# Patient Record
Sex: Male | Born: 1980 | Race: Black or African American | Hispanic: No | Marital: Single | Smoking: Never smoker
Health system: Southern US, Community
[De-identification: ages and names within clinical notes are randomized; demographics above are authoritative.]

## PROBLEM LIST (undated history)

## (undated) DIAGNOSIS — F4310 Post-traumatic stress disorder, unspecified: Secondary | ICD-10-CM

## (undated) DIAGNOSIS — F410 Panic disorder [episodic paroxysmal anxiety] without agoraphobia: Secondary | ICD-10-CM

## (undated) DIAGNOSIS — R55 Syncope and collapse: Secondary | ICD-10-CM

---

## 2009-09-25 ENCOUNTER — Emergency Department (HOSPITAL_COMMUNITY): Admission: EM | Admit: 2009-09-25 | Discharge: 2009-09-25 | Payer: Self-pay | Admitting: Emergency Medicine

## 2013-06-27 ENCOUNTER — Emergency Department (HOSPITAL_COMMUNITY)
Admission: EM | Admit: 2013-06-27 | Discharge: 2013-06-27 | Disposition: A | Discharge status: Home / Self Care | Payer: Non-veteran care | Attending: Emergency Medicine | Admitting: Emergency Medicine

## 2013-06-27 ENCOUNTER — Encounter (HOSPITAL_COMMUNITY): Payer: Self-pay | Admitting: Emergency Medicine

## 2013-06-27 DIAGNOSIS — Z79899 Other long term (current) drug therapy: Secondary | ICD-10-CM | POA: Insufficient documentation

## 2013-06-27 DIAGNOSIS — X131XXA Other contact with steam and other hot vapors, initial encounter: Secondary | ICD-10-CM

## 2013-06-27 DIAGNOSIS — Y929 Unspecified place or not applicable: Secondary | ICD-10-CM | POA: Insufficient documentation

## 2013-06-27 DIAGNOSIS — Z880 Allergy status to penicillin: Secondary | ICD-10-CM | POA: Insufficient documentation

## 2013-06-27 DIAGNOSIS — X12XXXA Contact with other hot fluids, initial encounter: Secondary | ICD-10-CM | POA: Insufficient documentation

## 2013-06-27 DIAGNOSIS — Y939 Activity, unspecified: Secondary | ICD-10-CM | POA: Insufficient documentation

## 2013-06-27 DIAGNOSIS — T25229A Burn of second degree of unspecified foot, initial encounter: Secondary | ICD-10-CM | POA: Insufficient documentation

## 2013-06-27 DIAGNOSIS — T25221A Burn of second degree of right foot, initial encounter: Secondary | ICD-10-CM

## 2013-06-27 MED ORDER — SILVER SULFADIAZINE 1 % EX CREA: 1.0000 "application " | TOPICAL_CREAM | Freq: Every day | CUTANEOUS | Status: DC

## 2013-06-27 NOTE — ED Provider Notes (Signed)
Medical screening examination/treatment/procedure(s) were performed by non-physician practitioner and as supervising physician I was immediately available for consultation/collaboration.  EKG Interpretation   None         Dagmar HaitWilliam Manjinder Breau, MD 06/27/13 2326

## 2013-06-27 NOTE — ED Notes (Signed)
Has burn to top of right foot, reports spilling hot water on his foot two days ago.

## 2013-06-27 NOTE — Discharge Instructions (Signed)
1. Medications: silvadene, usual home medications 2. Treatment: rest, drink plenty of fluids, keep wound clean with warm soap and water, keep bandages dry 3. Follow Up: Please followup with your primary doctor for discussion of your diagnoses and further evaluation after today's visit; if you do not have a primary care doctor use the resource guide provided to find one; Return to the ED for increasing redness, fevers or intractable vomiting.   Burn Care Your skin is a natural barrier to infection. It is the largest organ of your body. Burns damage this natural protection. To help prevent infection, it is very important to follow your caregiver's instructions in the care of your burn. Burns are classified as:  First degree. There is only redness of the skin (erythema). No scarring is expected.  Second degree. There is blistering of the skin. Scarring may occur with deeper burns.  Third degree. All layers of the skin are injured, and scarring is expected. HOME CARE INSTRUCTIONS   Wash your hands well before changing your bandage.  Change your bandage as often as directed by your caregiver.  Remove the old bandage. If the bandage sticks, you may soak it off with cool, clean water.  Cleanse the burn thoroughly but gently with mild soap and water.  Pat the area dry with a clean, dry cloth.  Apply a thin layer of antibacterial cream to the burn.  Apply a clean bandage as instructed by your caregiver.  Keep the bandage as clean and dry as possible.  Elevate the affected area for the first 24 hours, then as instructed by your caregiver.  Only take over-the-counter or prescription medicines for pain, discomfort, or fever as directed by your caregiver. SEEK IMMEDIATE MEDICAL CARE IF:   You develop excessive pain.  You develop redness, tenderness, swelling, or red streaks near the burn.  The burned area develops yellowish-white fluid (pus) or a bad smell.  You have a fever. MAKE SURE  YOU:   Understand these instructions.  Will watch your condition.  Will get help right away if you are not doing well or get worse. Document Released: 05/30/2005 Document Revised: 08/22/2011 Document Reviewed: 10/20/2010 [facility] Patient Information 2014 [place], [place].

## 2013-06-27 NOTE — ED Provider Notes (Signed)
CSN: [id number]     Arrival date & time 06/27/13  1653 History  This chart was scribed for non-physician practitioner Dierdre Forth, PA, working with Dagmar Hait, MD by Ronal Fear, ED scribe. This patient was seen in room TR05C/TR05C and the patient's care was started at 6:16 PM.     Chief Complaint  Patient presents with  . Burn   (Consider location/radiation/quality/duration/timing/severity/associated sxs/prior Treatment) The history is provided by the patient and medical records. No language interpreter was used.   HPI Comments: Hunter Riddle is a 33 y.o. male who presents to the Emergency Department complaining of a right side foot burn with associated throbbing, constant pain onset 2x days ago after spilling boiling water on his foot. Pt states that standing on it and movement makes the pain worse. Pt has washed the wound with warm soap and water and wrapped the foot. He has taken ibuprofen and elevated the foot which he states has helped with the pain. Pt denies fever, chills, N/V. 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  No primary provider on file.   History reviewed. No pertinent past medical history. History reviewed. No pertinent past surgical history. History reviewed. No pertinent family history. History  Substance Use Topics  . Smoking status: Never Smoker   . Smokeless tobacco: Not on file  . Alcohol Use: No    Review of Systems  Constitutional: Negative for fever and chills.  Gastrointestinal: Negative for nausea and vomiting.  Skin: Positive for wound. Negative for rash.  Allergic/Immunologic: Negative for immunocompromised state.  Hematological: Does not bruise/bleed easily.  Psychiatric/Behavioral: The patient is not nervous/anxious.     Allergies  Penicillins  Home Medications   Current Outpatient Rx  Name  Route  Sig  Dispense  Refill  . ibuprofen (ADVIL,MOTRIN) 200 MG tablet   Oral   Take 400 mg by mouth every 6 (six) hours as  needed for moderate pain.         . silver sulfADIAZINE (SILVADENE) 1 % cream   Topical   Apply 1 application topically daily.   50 g   2    BP 117/66  Pulse 63  Temp(Src) 98.7 F (37.1 C) (Oral)  Resp 18  SpO2 100% Physical Exam  Nursing note and vitals reviewed. Constitutional: He is oriented to person, place, and time. He appears well-developed and well-nourished. No distress.  HENT:  Head: Normocephalic and atraumatic.  Eyes: Conjunctivae are normal. No scleral icterus.  Neck: Normal range of motion.  Cardiovascular: Normal rate, regular rhythm, normal heart sounds and intact distal pulses.   No murmur heard. No tachycardia Capillary refill less than 3 seconds  Pulmonary/Chest: Effort normal and breath sounds normal.  Abdominal: Soft. He exhibits no distension. There is no tenderness.  Musculoskeletal:  Full range of motion of the ankle and all toes  Lymphadenopathy:    He has no cervical adenopathy.  Neurological: He is alert and oriented to person, place, and time.  Sensation intact to dull and sharp throughout Strength 5 out of 5 including dorsiflexion and plantar flexion  Skin: Skin is warm and dry. He is not diaphoretic. There is erythema.  12cm x 6 cm area of partial thickness burn with discrete borders to the dorsum of the right foot No erythema, induration, fluctuance or drainage from the site  Psychiatric: He has a normal mood and affect.    ED Course  Procedures (including critical care time)  DIAGNOSTIC STUDIES: Oxygen Saturation is  100% on RA, normal by my interpretation.    COORDINATION OF CARE: 6:24 PM- Pt advised of plan for treatment including silvadene cream for the burn and pt agrees.    Labs Review Labs Reviewed - No data to display Imaging Review No results found.  EKG Interpretation   None       MDM   1. Burn of foot, right, second degree     Lovenia ShuckBrian Ralphs presents with partial thickness burn to the dorsum of the right  foot. No erythema or induration, no drainage. No signs of infection or cellulitis. No fluctuance or evidence of abscess.  Patient is non-tachycardic, afebrile without complaints of nausea or vomiting.  Will prescribe Silvadene. Wound care instructions given. Close followup recommended.  It has been determined that no acute conditions requiring further emergency intervention are present at this time. The patient/guardian have been advised of the diagnosis and plan. We have discussed signs and symptoms that warrant return to the ED, such as changes or worsening in symptoms.   Vital signs are stable at discharge.   BP 117/66  Pulse 63  Temp(Src) 98.7 F (37.1 C) (Oral)  Resp 18  SpO2 100%  Patient/guardian has voiced understanding and agreed to follow-up with the PCP or specialist.    I personally performed the services described in this documentation, which was scribed in my presence. The recorded information has been reviewed and is accurate.   Dierdre ForthHannah Fareedah Mahler, PA-C 06/27/13 1843

## 2013-07-03 ENCOUNTER — Encounter (HOSPITAL_COMMUNITY): Payer: Self-pay | Admitting: Emergency Medicine

## 2013-07-03 ENCOUNTER — Emergency Department (INDEPENDENT_AMBULATORY_CARE_PROVIDER_SITE_OTHER)
Admission: EM | Admit: 2013-07-03 | Discharge: 2013-07-03 | Disposition: A | Discharge status: Home / Self Care | Payer: Non-veteran care | Source: Home / Self Care | Attending: Emergency Medicine | Admitting: Emergency Medicine

## 2013-07-03 DIAGNOSIS — T25021A Burn of unspecified degree of right foot, initial encounter: Secondary | ICD-10-CM

## 2013-07-03 DIAGNOSIS — T25029A Burn of unspecified degree of unspecified foot, initial encounter: Secondary | ICD-10-CM

## 2013-07-03 DIAGNOSIS — Z23 Encounter for immunization: Secondary | ICD-10-CM

## 2013-07-03 MED ORDER — TETANUS-DIPHTH-ACELL PERTUSSIS 5-2.5-18.5 LF-MCG/0.5 IM SUSP
INTRAMUSCULAR | Status: AC
  Filled 2013-07-03: qty 0.5

## 2013-07-03 MED ORDER — TETANUS-DIPHTH-ACELL PERTUSSIS 5-2.5-18.5 LF-MCG/0.5 IM SUSP
0.5000 mL | Freq: Once | INTRAMUSCULAR | Status: AC
  Administered 2013-07-03: 0.5 mL via INTRAMUSCULAR

## 2013-07-03 NOTE — ED Notes (Signed)
Pt  Sustained  A  Thermal  Burn  To the  Top  Of his  r  Foot  10  Days  Ago        He was  Seen  Er here  Today  For  A  Recheck of the  Burn   The burn appears to  Be  Healing       He  Does report  Pain  When  Pressure  Is  Put  On the  Foot

## 2013-07-03 NOTE — ED Provider Notes (Signed)
CSN: [id number]     Arrival date & time 07/03/13  0818 History   First MD Initiated Contact with Patient 07/03/13 0831     Chief Complaint  Patient presents with  . Wound Check   (Consider location/radiation/quality/duration/timing/severity/associated sxs/prior Treatment) HPI Comments: Patient suffered a partial thickness burn from hot water spill to his right foot on 06/23/2013. [facility] with silvadene dressing and advised to come for wound re-check. Denies any difficulties with wound since initial treatment. Last tetanus booster unknown.   Patient is a 33 y.o. male presenting with wound check. The history is provided by the patient.  Wound Check    History reviewed. No pertinent past medical history. History reviewed. No pertinent past surgical history. History reviewed. No pertinent family history. History  Substance Use Topics  . Smoking status: Never Smoker   . Smokeless tobacco: Not on file  . Alcohol Use: No    Review of Systems  All other systems reviewed and are negative.    Allergies  Penicillins  Home Medications   Current Outpatient Rx  Name  Route  Sig  Dispense  Refill  . ibuprofen (ADVIL,MOTRIN) 200 MG tablet   Oral   Take 400 mg by mouth every 6 (six) hours as needed for moderate pain.         . silver sulfADIAZINE (SILVADENE) 1 % cream   Topical   Apply 1 application topically daily.   50 g   2    BP 113/74  Pulse 65  Temp(Src) 97.8 F (36.6 C) (Oral)  Resp 18  SpO2 100% Physical Exam  Nursing note and vitals reviewed. Constitutional: He is oriented to person, place, and time. He appears well-developed and well-nourished. No distress.  HENT:  Head: Normocephalic and atraumatic.  Cardiovascular: Normal rate.   Pulmonary/Chest: Effort normal.  Neurological: He is alert and oriented to person, place, and time.  Skin: Skin is warm and dry.  3 x 5 cm healing  partial thickness burn to dorsum of right  foot. No indications of infection. Wound is minimally tender without drainage.   Psychiatric: He has a normal mood and affect. His behavior is normal.    ED Course  Procedures (including critical care time) Labs Review Labs Reviewed - No data to display Imaging Review No results found.  EKG Interpretation    Date/Time:    Ventricular Rate:    PR Interval:    QRS Duration:   QT Interval:    QTC Calculation:   R Axis:     Text Interpretation:              MDM      Ardis RowanJennifer Lee Merlen Gurry, PA 07/03/13 [phone]

## 2013-07-03 NOTE — ED Provider Notes (Signed)
Medical screening examination/treatment/procedure(s) were performed by non-physician practitioner and as supervising physician I was immediately available for consultation/collaboration.  Devontre Siedschlag, M.D.   [name] C [name], MD 07/03/13 1401 

## 2013-07-03 NOTE — Discharge Instructions (Signed)

## 2013-11-09 ENCOUNTER — Emergency Department (HOSPITAL_COMMUNITY): Payer: Non-veteran care

## 2013-11-09 ENCOUNTER — Encounter (HOSPITAL_COMMUNITY): Payer: Self-pay | Admitting: Emergency Medicine

## 2013-11-09 ENCOUNTER — Observation Stay (HOSPITAL_COMMUNITY)
Admission: EM | Admit: 2013-11-09 | Discharge: 2013-11-12 | Disposition: A | Discharge status: Home / Self Care | Payer: Non-veteran care | Attending: Internal Medicine | Admitting: Internal Medicine

## 2013-11-09 DIAGNOSIS — Z8659 Personal history of other mental and behavioral disorders: Secondary | ICD-10-CM | POA: Diagnosis not present

## 2013-11-09 DIAGNOSIS — R0789 Other chest pain: Secondary | ICD-10-CM | POA: Diagnosis not present

## 2013-11-09 DIAGNOSIS — D509 Iron deficiency anemia, unspecified: Secondary | ICD-10-CM

## 2013-11-09 DIAGNOSIS — S199XXA Unspecified injury of neck, initial encounter: Secondary | ICD-10-CM

## 2013-11-09 DIAGNOSIS — S0993XA Unspecified injury of face, initial encounter: Secondary | ICD-10-CM | POA: Insufficient documentation

## 2013-11-09 DIAGNOSIS — Z79899 Other long term (current) drug therapy: Secondary | ICD-10-CM | POA: Insufficient documentation

## 2013-11-09 DIAGNOSIS — R296 Repeated falls: Secondary | ICD-10-CM | POA: Diagnosis not present

## 2013-11-09 DIAGNOSIS — Y9389 Activity, other specified: Secondary | ICD-10-CM | POA: Diagnosis not present

## 2013-11-09 DIAGNOSIS — Y9229 Other specified public building as the place of occurrence of the external cause: Secondary | ICD-10-CM | POA: Diagnosis not present

## 2013-11-09 DIAGNOSIS — R209 Unspecified disturbances of skin sensation: Secondary | ICD-10-CM | POA: Diagnosis not present

## 2013-11-09 DIAGNOSIS — R079 Chest pain, unspecified: Secondary | ICD-10-CM

## 2013-11-09 DIAGNOSIS — S3981XA Other specified injuries of abdomen, initial encounter: Secondary | ICD-10-CM | POA: Insufficient documentation

## 2013-11-09 DIAGNOSIS — S060X9A Concussion with loss of consciousness of unspecified duration, initial encounter: Secondary | ICD-10-CM | POA: Diagnosis not present

## 2013-11-09 DIAGNOSIS — R4182 Altered mental status, unspecified: Secondary | ICD-10-CM | POA: Diagnosis not present

## 2013-11-09 DIAGNOSIS — Z880 Allergy status to penicillin: Secondary | ICD-10-CM | POA: Diagnosis not present

## 2013-11-09 DIAGNOSIS — F1210 Cannabis abuse, uncomplicated: Secondary | ICD-10-CM

## 2013-11-09 DIAGNOSIS — R55 Syncope and collapse: Principal | ICD-10-CM | POA: Insufficient documentation

## 2013-11-09 DIAGNOSIS — F1290 Cannabis use, unspecified, uncomplicated: Secondary | ICD-10-CM

## 2013-11-09 DIAGNOSIS — S060XAA Concussion with loss of consciousness status unknown, initial encounter: Secondary | ICD-10-CM | POA: Diagnosis not present

## 2013-11-09 DIAGNOSIS — R404 Transient alteration of awareness: Secondary | ICD-10-CM | POA: Diagnosis present

## 2013-11-09 HISTORY — DX: Syncope and collapse: R55

## 2013-11-09 HISTORY — DX: Post-traumatic stress disorder, unspecified: F43.10

## 2013-11-09 HISTORY — DX: Panic disorder (episodic paroxysmal anxiety): F41.0

## 2013-11-09 LAB — BASIC METABOLIC PANEL
BUN: 5 mg/dL — ABNORMAL LOW (ref 6–23)
CHLORIDE: 105 meq/L (ref 96–112)
CO2: 27 meq/L (ref 19–32)
CREATININE: 0.81 mg/dL (ref 0.50–1.35)
Calcium: 8.3 mg/dL — ABNORMAL LOW (ref 8.4–10.5)
GFR calc Af Amer: >90 mL/min (ref >90)
GFR calc non Af Amer: >90 mL/min (ref >90)
GLUCOSE: 98 mg/dL (ref 70–99)
Potassium: 3.7 mEq/L (ref 3.7–5.3)
Sodium: 139 mEq/L (ref 137–147)

## 2013-11-09 LAB — CBC WITH DIFFERENTIAL/PLATELET
BASOS ABS: 0 10*3/uL (ref 0.0–0.1)
Basophils Relative: 1 % (ref 0–1)
Eosinophils Absolute: 0 10*3/uL (ref 0.0–0.7)
Eosinophils Relative: 1 % (ref 0–5)
HEMATOCRIT: 37.9 % — AB (ref 39.0–52.0)
Hemoglobin: 12.3 g/dL — ABNORMAL LOW (ref 13.0–17.0)
LYMPHS ABS: 1.9 10*3/uL (ref 0.7–4.0)
LYMPHS PCT: 33 % (ref 12–46)
MCH: 23.9 pg — ABNORMAL LOW (ref 26.0–34.0)
MCHC: 32.5 g/dL (ref 30.0–36.0)
MCV: 73.6 fL — AB (ref 78.0–100.0)
MONO ABS: 0.5 10*3/uL (ref 0.1–1.0)
Monocytes Relative: 9 % (ref 3–12)
Neutro Abs: 3.3 10*3/uL (ref 1.7–7.7)
Neutrophils Relative %: 57 % (ref 43–77)
Platelets: 246 10*3/uL (ref 150–400)
RBC: 5.15 MIL/uL (ref 4.22–5.81)
RDW: 14.6 % (ref 11.5–15.5)
WBC: 5.9 10*3/uL (ref 4.0–10.5)

## 2013-11-09 LAB — RAPID URINE DRUG SCREEN, HOSP PERFORMED
AMPHETAMINES: NOT DETECTED
BENZODIAZEPINES: NOT DETECTED
Barbiturates: NOT DETECTED
Cocaine: NOT DETECTED
Opiates: NOT DETECTED
TETRAHYDROCANNABINOL: POSITIVE — AB

## 2013-11-09 LAB — TROPONIN I: Troponin I: <0.3 ng/mL (ref <0.30)

## 2013-11-09 LAB — D-DIMER, QUANTITATIVE: D-Dimer, Quant: 0.28 ug/mL-FEU (ref 0.00–0.48)

## 2013-11-09 LAB — RETICULOCYTES
RBC.: 4.72 MIL/uL (ref 4.22–5.81)
RETIC CT PCT: 1.6 % (ref 0.4–3.1)
Retic Count, Absolute: 75.5 10*3/uL (ref 19.0–186.0)

## 2013-11-09 LAB — ETHANOL

## 2013-11-09 LAB — GLUCOSE, RANDOM: Glucose, Bld: 101 mg/dL — ABNORMAL HIGH (ref 70–99)

## 2013-11-09 LAB — I-STAT TROPONIN, ED: TROPONIN I, POC: 0.01 ng/mL (ref 0.00–0.08)

## 2013-11-09 MED ORDER — SODIUM CHLORIDE 0.9 % IJ SOLN
3.0000 mL | Freq: Two times a day (BID) | INTRAMUSCULAR | Status: DC
  Administered 2013-11-09: 3 mL via INTRAVENOUS

## 2013-11-09 MED ORDER — ONDANSETRON HCL 4 MG PO TABS: 4.0000 mg | ORAL_TABLET | Freq: Four times a day (QID) | ORAL | Status: DC | PRN

## 2013-11-09 MED ORDER — ONDANSETRON HCL 4 MG/2ML IJ SOLN: 4.0000 mg | Freq: Four times a day (QID) | INTRAMUSCULAR | Status: DC | PRN

## 2013-11-09 MED ORDER — ACETAMINOPHEN 325 MG PO TABS: 650.0000 mg | ORAL_TABLET | Freq: Four times a day (QID) | ORAL | Status: DC | PRN

## 2013-11-09 MED ORDER — HYDROMORPHONE HCL PF 1 MG/ML IJ SOLN: 0.5000 mg | INTRAMUSCULAR | Status: DC | PRN

## 2013-11-09 MED ORDER — ALUM & MAG HYDROXIDE-SIMETH 200-200-20 MG/5ML PO SUSP: 30.0000 mL | Freq: Four times a day (QID) | ORAL | Status: DC | PRN

## 2013-11-09 MED ORDER — ONDANSETRON HCL 4 MG/2ML IJ SOLN
4.0000 mg | Freq: Once | INTRAMUSCULAR | Status: AC
  Administered 2013-11-09: 4 mg via INTRAVENOUS
  Filled 2013-11-09: qty 2

## 2013-11-09 MED ORDER — OXYCODONE HCL 5 MG PO TABS: 5.0000 mg | ORAL_TABLET | ORAL | Status: DC | PRN

## 2013-11-09 MED ORDER — ACETAMINOPHEN 650 MG RE SUPP: 650.0000 mg | Freq: Four times a day (QID) | RECTAL | Status: DC | PRN

## 2013-11-09 MED ORDER — SODIUM CHLORIDE 0.9 % IV BOLUS (SEPSIS)
1000.0000 mL | Freq: Once | INTRAVENOUS | Status: AC
  Administered 2013-11-09: 1000 mL via INTRAVENOUS

## 2013-11-09 MED ORDER — SODIUM CHLORIDE 0.9 % IV SOLN
INTRAVENOUS | Status: DC
  Administered 2013-11-09 – 2013-11-11 (×4): via INTRAVENOUS

## 2013-11-09 NOTE — ED Notes (Signed)
Pt to ED after reported having a syncopal episode while standing in line at [employer].  St's he hit back of his head when he fell and c/o headache.  Pt refused EMS transport and then drove to work.  Pt was brought to ED by friend.

## 2013-11-09 NOTE — ED Provider Notes (Signed)
CSN: [id number]     Arrival date & time 11/09/13  1615 History   First MD Initiated Contact with Patient 11/09/13 1619     Chief Complaint  Patient presents with  . Loss of Consciousness     (Consider location/radiation/quality/duration/timing/severity/associated sxs/prior Treatment) HPI  Patient reports he was standing in line at a store, started feeling "woozy," tried to lean against a counter top, then remembers waking up with people standing over him.  Reports he has pain in his head and neck, tingling in his bilateral fingers, tightness in his toes, pressure in his chest.   After event, pt refused EMS transport, drove himself to work and then was brought to ED by work Animatorcolleague.    5:40 PM Significant other now bedside. Notes pt has hx syncopal episodes with unknown cause.  Has never seen a doctor for this.   6:26 PM Pt now states he has chest heaviness and SOB prior to all of his syncopal episodes.    Level V caveat for uncooperativeness vs sleepiness vs head injury.   Past Medical History  Diagnosis Date  . Panic attack   . PTSD (post-traumatic stress disorder)   . Syncopal episodes    History reviewed. No pertinent past surgical history. No family history on file. History  Substance Use Topics  . Smoking status: Never Smoker   . Smokeless tobacco: Not on file  . Alcohol Use: No    Review of Systems  Unable to perform ROS: Mental status change      Allergies  Penicillins  Home Medications   Prior to Admission medications   Medication Sig Start Date End Date Taking? Authorizing Provider  ibuprofen (ADVIL,MOTRIN) 200 MG tablet Take 400 mg by mouth every 6 (six) hours as needed for moderate pain.    Historical Provider, MD  silver sulfADIAZINE (SILVADENE) 1 % cream Apply 1 application topically daily. 06/27/13   Hannah Muthersbaugh, PA-C   BP 112/59  Pulse 63  Temp(Src) 98 F (36.7 C) (Oral)  Resp 19  Ht 5\' 11"  (1.803 m)  Wt 182 lb (82.555 kg)  BMI 25.40  kg/m2  SpO2 100% Physical Exam  Nursing note and vitals reviewed. Constitutional: He appears well-developed and well-nourished. No distress.  HENT:  Head: Normocephalic.  Neck: Neck supple.  Cardiovascular: Normal rate and regular rhythm.   Pulmonary/Chest: Effort normal and breath sounds normal. No respiratory distress. He has no wheezes. He has no rales.  Abdominal: Soft. He exhibits no distension and no mass. There is generalized tenderness. There is no rebound and no guarding.  Neurological: He is alert. He has normal strength. No sensory deficit. He exhibits normal muscle tone. GCS eye subscore is 4. GCS verbal subscore is 5. GCS motor subscore is 6.  Pt sleepy vs uncooperative.  He responds to commands with some prompting   Skin: He is not diaphoretic.    ED Course  Procedures (including critical care time) Labs Review Labs Reviewed  CBC WITH DIFFERENTIAL - Abnormal; Notable for the following:    Hemoglobin 12.3 (*)    HCT 37.9 (*)    MCV 73.6 (*)    MCH 23.9 (*)    All other components within normal limits  BASIC METABOLIC PANEL - Abnormal; Notable for the following:    BUN 5 (*)    Calcium 8.3 (*)    All other components within normal limits  URINE RAPID DRUG SCREEN (HOSP PERFORMED) - Abnormal; Notable for the following:    Tetrahydrocannabinol POSITIVE (*)  All other components within normal limits  ETHANOL  D-DIMER, QUANTITATIVE  I-STAT TROPOININ, ED    Imaging Review Dg Chest 2 View  11/09/2013   CLINICAL DATA:  Syncope  EXAM: CHEST  2 VIEW  COMPARISON:  None.  FINDINGS: Lungs are clear.  No pleural effusion or pneumothorax.  The heart is normal in size.  Visualized osseous structures are within normal limits.  IMPRESSION: Normal chest radiographs.   Electronically Signed   By: Charline Bills M.D.   On: 11/09/2013 17:27   Ct Head Wo Contrast  11/09/2013   CLINICAL DATA:  LOSS OF CONSCIOUSNESS  EXAM: CT HEAD WITHOUT CONTRAST  CT CERVICAL SPINE WITHOUT  CONTRAST  TECHNIQUE: Multidetector CT imaging of the head and cervical spine was performed following the standard protocol without intravenous contrast. Multiplanar CT image reconstructions of the cervical spine were also generated.  COMPARISON:  None.  FINDINGS: CT HEAD FINDINGS  There is no evidence of mass effect, midline shift or extra-axial fluid collections. There is no evidence of a space-occupying lesion or intracranial hemorrhage. There is no evidence of a cortical-based area of acute infarction.  The ventricles and sulci are appropriate for the patient's age. The basal cisterns are patent.  Visualized portions of the orbits are unremarkable. The visualized portions of the paranasal sinuses and mastoid air cells are unremarkable.  The osseous structures are unremarkable.  CT CERVICAL SPINE FINDINGS  The alignment is anatomic. The vertebral body heights are maintained. There is no acute fracture. There is no static listhesis. The prevertebral soft tissues are normal. The intraspinal soft tissues are not fully imaged on this examination due to poor soft tissue contrast, but there is no gross soft tissue abnormality.  The disc spaces are maintained.  The visualized portions of the lung apices demonstrate no focal abnormality.  IMPRESSION: 1. No acute intracranial pathology. 2. No acute osseous injury of the cervical spine.   Electronically Signed   By: Elige Ko   On: 11/09/2013 17:37   Ct Cervical Spine Wo Contrast  11/09/2013   CLINICAL DATA:  LOSS OF CONSCIOUSNESS  EXAM: CT HEAD WITHOUT CONTRAST  CT CERVICAL SPINE WITHOUT CONTRAST  TECHNIQUE: Multidetector CT imaging of the head and cervical spine was performed following the standard protocol without intravenous contrast. Multiplanar CT image reconstructions of the cervical spine were also generated.  COMPARISON:  None.  FINDINGS: CT HEAD FINDINGS  There is no evidence of mass effect, midline shift or extra-axial fluid collections. There is no  evidence of a space-occupying lesion or intracranial hemorrhage. There is no evidence of a cortical-based area of acute infarction.  The ventricles and sulci are appropriate for the patient's age. The basal cisterns are patent.  Visualized portions of the orbits are unremarkable. The visualized portions of the paranasal sinuses and mastoid air cells are unremarkable.  The osseous structures are unremarkable.  CT CERVICAL SPINE FINDINGS  The alignment is anatomic. The vertebral body heights are maintained. There is no acute fracture. There is no static listhesis. The prevertebral soft tissues are normal. The intraspinal soft tissues are not fully imaged on this examination due to poor soft tissue contrast, but there is no gross soft tissue abnormality.  The disc spaces are maintained.  The visualized portions of the lung apices demonstrate no focal abnormality.  IMPRESSION: 1. No acute intracranial pathology. 2. No acute osseous injury of the cervical spine.   Electronically Signed   By: Elige Ko   On: 11/09/2013 17:37  EKG Interpretation   Date/Time:  Saturday Nov 09 2013 16:23:24 EDT Ventricular Rate:  62 PR Interval:  167 QRS Duration: 84 QT Interval:  408 QTC Calculation: 414 R Axis:   83 Text Interpretation:  Sinus rhythm ST elev, probable normal early repol  pattern No previous ECGs available Confirmed by ZACKOWSKI  MD, SCOTT  ([id number]) on 11/09/2013 5:09:09 PM      5:39 PM Pt still sleepy.  Significant other now bedside, states he has syncopal episodes at least once a year, has never followed up with a doctor about it.  Usually states he doesn't feel well prior to passing out.  Usually comes around quickly, never has any shaking.    6:31 PM Discussed pt with Dr Deretha Emory.  Have added POC troponin and d-dimer.    7:55 PM Discussed with Dr Lovell Sheehan.  Admit to obs tele Team 10.    MDM   Final diagnoses:  Syncope  Concussion    Pt with episode of syncope.  Per patient and  significant other, pt has had syncopal episodes previously, always preceded by chest pain and difficulty breathing for a few seconds.  This time is different in that he has been sleepy and slower to come back to baseline (still sleepy at time of admission).  I suspect he has sustained a concussion from falling and hitting his head.  His workup is unremarkable with exception of anemia, which is atypical for a male his age - he denies any abnormal bleeding including epistaxis, hemoptysis, hematemesis, hematuria, hematochezia, melena.  Pt admitted to [facility] for further evaluation and observation.   He has no PCP.      Trixie Dredge, PA-C 11/09/13 2009

## 2013-11-09 NOTE — ED Notes (Signed)
Dr. Lovell Sheehan in to assess pt for admission at this time.

## 2013-11-09 NOTE — ED Notes (Signed)
Attempted report 

## 2013-11-09 NOTE — H&P (Signed)
[facility] History and Physical  Hunter Riddle UJW:[id number] DOB: [birth year] DOA: 11/09/2013  Referring physician: EDP PCP: No PCP Per Patient  Specialists:   Chief Complaint: Passed Out  HPI: Hunter Riddle is a 33 y.o. male who was brought to the ED after he suffered a syncopal episode in [employer] while he was standing in line.    He reports that he felt chest pain and dizziness and then blacked out and fell back and hit his head.   He denied EMS transport to the ED and instead was brought to the ED by his friend.    He reports having syncope with the same  symptoms about once a year for many years and he has not had a hospital evaluation for the episodes.   He was evaluated in the ED and had a CT scan of the Head and Neck which was negative for acute findings and had labs performed which revealed a Microcytic Anemia, and he denies having any hematemesis, hematochezia, or melena passage.    He was referred for medical admission.     Review of Systems:  Constitutional: No Weight Loss, No Weight Gain, Night Sweats, Fevers, Chills, Fatigue, or Generalized Weakness HEENT: Headaches, Difficulty Swallowing,Tooth/Dental Problems,Sore Throat,  No Sneezing, Rhinitis, Ear Ache, Nasal Congestion, or Post Nasal Drip,  Cardio-vascular:  +Chest pain, Orthopnea, PND, Edema in lower extremities, Anasarca, Dizziness, Palpitations  Resp: No Dyspnea, No DOE, No Cough, No Hemoptysis,  No Wheezing.    GI: No Heartburn, Indigestion, Abdominal Pain, Nausea, Vomiting, Diarrhea, Change in Bowel Habits,  Loss of Appetite  GU: No Dysuria, Change in Color of Urine, No Urgency or Frequency.  No flank pain.  Musculoskeletal: No Joint Pain or Swelling.  No Decreased Range of Motion. No Back Pain.  Neurologic: +Syncope, No Seizures, Muscle Weakness, Paresthesia, Vision Disturbance or Loss, No Diplopia, No Vertigo, No Difficulty Walking,  Skin: No Rash or Lesions. Psych: No Change in Mood or Affect. No Depression or  Anxiety. No Memory loss. No Confusion or Hallucinations   Past Medical History  Diagnosis Date  . Panic attack   . PTSD (post-traumatic stress disorder)   . Syncopal episodes     History reviewed. No pertinent past surgical history.    Prior to Admission medications   Not on File    Allergies  Allergen Reactions  . Penicillins Anaphylaxis    Social History:  reports that he has never smoked. He does not have any smokeless tobacco history on file. He reports that he does not drink alcohol or use illicit drugs.     No family history on file.     Physical Exam:  GEN:  Pleasant Well Nourished and Well Developed 33 y.o. Caucasian male examined  and in no acute distress; cooperative with exam Filed Vitals:   11/09/13 1845 11/09/13 1915 11/09/13 1945 11/09/13 2000  BP: 104/58 103/43 117/66 104/59  Pulse: 58 58 73 59  Temp:      TempSrc:      Resp: 16 16 20 12   Height:      Weight:      SpO2: 98% 100% 100% 100%   Blood pressure 104/59, pulse 59, temperature 98 F (36.7 C), temperature source Oral, resp. rate 12, height 5\' 11"  (1.803 m), weight 82.555 kg (182 lb), SpO2 100.00%. PSYCH: He is alert and oriented x4; does not appear anxious does not appear depressed; affect is normal HEENT: Normocephalic and Atraumatic, Mucous membranes pink; PERRLA; EOM intact; Fundi:  Benign;  No scleral icterus, Nares: Patent, Oropharynx: Clear, Fair Dentition, Neck:  FROM, no cervical lymphadenopathy nor thyromegaly or carotid bruit; no JVD; Breasts:: Not examined CHEST WALL: No tenderness CHEST: Normal respiration, clear to auscultation bilaterally HEART: Regular rate and rhythm; no murmurs rubs or gallops BACK: No kyphosis or scoliosis; no CVA tenderness ABDOMEN: Positive Bowel Sounds, Scaphoid, soft non-tender; no masses, no organomegaly. Rectal Exam: Not done EXTREMITIES: No cyanosis, clubbing or edema; no ulcerations. Genitalia: not examined PULSES: 2+ and symmetric SKIN: Normal  hydration no rash or ulceration CNS:  Alert and Oriented x 4,  No Focal Deficits Vascular: pulses palpable throughout     Labs on Admission:  Basic Metabolic Panel:  Recent Labs Lab 11/09/13 1652  NA 139  K 3.7  CL 105  CO2 27  GLUCOSE 98  BUN 5*  CREATININE 0.81  CALCIUM 8.3*   Liver Function Tests: No results found for this basename: AST, ALT, ALKPHOS, BILITOT, PROT, ALBUMIN,  in the last 168 hours No results found for this basename: LIPASE, AMYLASE,  in the last 168 hours No results found for this basename: AMMONIA,  in the last 168 hours CBC:  Recent Labs Lab 11/09/13 1652  WBC 5.9  NEUTROABS 3.3  HGB 12.3*  HCT 37.9*  MCV 73.6*  PLT 246   Cardiac Enzymes: No results found for this basename: CKTOTAL, CKMB, CKMBINDEX, TROPONINI,  in the last 168 hours  BNP (last 3 results) No results found for this basename: PROBNP,  in the last 8760 hours CBG: No results found for this basename: GLUCAP,  in the last 168 hours  Radiological Exams on Admission: Dg Chest 2 View  11/09/2013   CLINICAL DATA:  Syncope  EXAM: CHEST  2 VIEW  COMPARISON:  None.  FINDINGS: Lungs are clear.  No pleural effusion or pneumothorax.  The heart is normal in size.  Visualized osseous structures are within normal limits.  IMPRESSION: Normal chest radiographs.   Electronically Signed   By: Charline Bills M.D.   On: 11/09/2013 17:27   Ct Head Wo Contrast  11/09/2013   CLINICAL DATA:  LOSS OF CONSCIOUSNESS  EXAM: CT HEAD WITHOUT CONTRAST  CT CERVICAL SPINE WITHOUT CONTRAST  TECHNIQUE: Multidetector CT imaging of the head and cervical spine was performed following the standard protocol without intravenous contrast. Multiplanar CT image reconstructions of the cervical spine were also generated.  COMPARISON:  None.  FINDINGS: CT HEAD FINDINGS  There is no evidence of mass effect, midline shift or extra-axial fluid collections. There is no evidence of a space-occupying lesion or intracranial  hemorrhage. There is no evidence of a cortical-based area of acute infarction.  The ventricles and sulci are appropriate for the patient's age. The basal cisterns are patent.  Visualized portions of the orbits are unremarkable. The visualized portions of the paranasal sinuses and mastoid air cells are unremarkable.  The osseous structures are unremarkable.  CT CERVICAL SPINE FINDINGS  The alignment is anatomic. The vertebral body heights are maintained. There is no acute fracture. There is no static listhesis. The prevertebral soft tissues are normal. The intraspinal soft tissues are not fully imaged on this examination due to poor soft tissue contrast, but there is no gross soft tissue abnormality.  The disc spaces are maintained.  The visualized portions of the lung apices demonstrate no focal abnormality.  IMPRESSION: 1. No acute intracranial pathology. 2. No acute osseous injury of the cervical spine.   Electronically Signed   By: Elige Ko  On: 11/09/2013 17:37   Ct Cervical Spine Wo Contrast  11/09/2013   CLINICAL DATA:  LOSS OF CONSCIOUSNESS  EXAM: CT HEAD WITHOUT CONTRAST  CT CERVICAL SPINE WITHOUT CONTRAST  TECHNIQUE: Multidetector CT imaging of the head and cervical spine was performed following the standard protocol without intravenous contrast. Multiplanar CT image reconstructions of the cervical spine were also generated.  COMPARISON:  None.  FINDINGS: CT HEAD FINDINGS  There is no evidence of mass effect, midline shift or extra-axial fluid collections. There is no evidence of a space-occupying lesion or intracranial hemorrhage. There is no evidence of a cortical-based area of acute infarction.  The ventricles and sulci are appropriate for the patient's age. The basal cisterns are patent.  Visualized portions of the orbits are unremarkable. The visualized portions of the paranasal sinuses and mastoid air cells are unremarkable.  The osseous structures are unremarkable.  CT CERVICAL SPINE FINDINGS   The alignment is anatomic. The vertebral body heights are maintained. There is no acute fracture. There is no static listhesis. The prevertebral soft tissues are normal. The intraspinal soft tissues are not fully imaged on this examination due to poor soft tissue contrast, but there is no gross soft tissue abnormality.  The disc spaces are maintained.  The visualized portions of the lung apices demonstrate no focal abnormality.  IMPRESSION: 1. No acute intracranial pathology. 2. No acute osseous injury of the cervical spine.   Electronically Signed   By: Elige Ko   On: 11/09/2013 17:37     EKG: Independently reviewed.   Normal Sinus Rhythm,  Early Repolarization,   No Acute S-T changes.      Assessment/Plan:   33 y.o. male with  Principal Problem:   Syncope and collapse Active Problems:   Syncope   Concussion   Chest pain   Microcytic anemia   Marijuana use    1.     Syncope and collapse-   Admitted for Observation and Syncope Workup, Monitor on Telemetry,  Cycle Troponins, Neuro checks,    check Orthostatics q shift, and glucose levels q 4 hrs, and monitor O2 sats.       2.    Concussion- due to impact from fall in #1.   Monitor for neurologic changes.      3.    Chest Pain-   Telemetry monitoring, and cycle Troponins.      4.    Microcytic Anemia- Anemia panel ordered, and FOBT q day X 3.   Monitor H/h daily.      5.    Marijuana Use-  Counseled.      6.    SCDs for DVT Prophylaxis.         Code Status:   FULL CODE    Family Communication:    Family at Bedside Disposition Plan:   Observation   Time spent:  56 Minutes  Eric Nees Velora Heckler [facility] Pager [phone]  If 7PM-7AM, please contact night-coverage [email or web address] Password [email or web address] 11/09/2013, 8:30 PM

## 2013-11-09 NOTE — ED Provider Notes (Signed)
Medical screening examination/treatment/procedure(s) were conducted as a shared visit with non-physician practitioner(s) and myself.  I personally evaluated the patient during the encounter.   EKG Interpretation   Date/Time:  Saturday Nov 09 2013 16:23:24 EDT Ventricular Rate:  62 PR Interval:  167 QRS Duration: 84 QT Interval:  408 QTC Calculation: 414 R Axis:   83 Text Interpretation:  Sinus rhythm ST elev, probable normal early repol  pattern No previous ECGs available Confirmed by Lelynd Poer  MD, Marija Calamari  [phone]) on 11/09/2013 5:09:09 PM      Results for orders placed during the hospital encounter of 11/09/13  CBC WITH DIFFERENTIAL      Result Value Ref Range   WBC 5.9  4.0 - 10.5 K/uL   RBC 5.15  4.22 - 5.81 MIL/uL   Hemoglobin 12.3 (*) 13.0 - 17.0 g/dL   HCT 62.1 (*) 30.8 - 65.7 %   MCV 73.6 (*) 78.0 - 100.0 fL   MCH 23.9 (*) 26.0 - 34.0 pg   MCHC 32.5  30.0 - 36.0 g/dL   RDW 84.6  96.2 - 95.2 %   Platelets 246  150 - 400 K/uL   Neutrophils Relative % 57  43 - 77 %   Neutro Abs 3.3  1.7 - 7.7 K/uL   Lymphocytes Relative 33  12 - 46 %   Lymphs Abs 1.9  0.7 - 4.0 K/uL   Monocytes Relative 9  3 - 12 %   Monocytes Absolute 0.5  0.1 - 1.0 K/uL   Eosinophils Relative 1  0 - 5 %   Eosinophils Absolute 0.0  0.0 - 0.7 K/uL   Basophils Relative 1  0 - 1 %   Basophils Absolute 0.0  0.0 - 0.1 K/uL  BASIC METABOLIC PANEL      Result Value Ref Range   Sodium 139  137 - 147 mEq/L   Potassium 3.7  3.7 - 5.3 mEq/L   Chloride 105  96 - 112 mEq/L   CO2 27  19 - 32 mEq/L   Glucose, Bld 98  70 - 99 mg/dL   BUN 5 (*) 6 - 23 mg/dL   Creatinine, Ser 8.41  0.50 - 1.35 mg/dL   Calcium 8.3 (*) 8.4 - 10.5 mg/dL   GFR calc non Af Amer >90  >90 mL/min   GFR calc Af Amer >90  >90 mL/min  ETHANOL      Result Value Ref Range   Alcohol, Ethyl (B) <11  0 - 11 mg/dL  URINE RAPID DRUG SCREEN (HOSP PERFORMED)      Result Value Ref Range   Opiates NONE DETECTED  NONE DETECTED   Cocaine NONE  DETECTED  NONE DETECTED   Benzodiazepines NONE DETECTED  NONE DETECTED   Amphetamines NONE DETECTED  NONE DETECTED   Tetrahydrocannabinol POSITIVE (*) NONE DETECTED   Barbiturates NONE DETECTED  NONE DETECTED  D-DIMER, QUANTITATIVE      Result Value Ref Range   D-Dimer, Quant 0.28  0.00 - 0.48 ug/mL-FEU  I-STAT TROPOININ, ED      Result Value Ref Range   Troponin i, poc 0.01  0.00 - 0.08 ng/mL   Comment 3            Results for orders placed during the hospital encounter of 11/09/13  CBC WITH DIFFERENTIAL      Result Value Ref Range   WBC 5.9  4.0 - 10.5 K/uL   RBC 5.15  4.22 - 5.81 MIL/uL   Hemoglobin 12.3 (*)  13.0 - 17.0 g/dL   HCT 37.5 (*) 43.6 - 06.7 %   MCV 73.6 (*) 78.0 - 100.0 fL   MCH 23.9 (*) 26.0 - 34.0 pg   MCHC 32.5  30.0 - 36.0 g/dL   RDW 70.3  40.3 - 52.4 %   Platelets 246  150 - 400 K/uL   Neutrophils Relative % 57  43 - 77 %   Neutro Abs 3.3  1.7 - 7.7 K/uL   Lymphocytes Relative 33  12 - 46 %   Lymphs Abs 1.9  0.7 - 4.0 K/uL   Monocytes Relative 9  3 - 12 %   Monocytes Absolute 0.5  0.1 - 1.0 K/uL   Eosinophils Relative 1  0 - 5 %   Eosinophils Absolute 0.0  0.0 - 0.7 K/uL   Basophils Relative 1  0 - 1 %   Basophils Absolute 0.0  0.0 - 0.1 K/uL  BASIC METABOLIC PANEL      Result Value Ref Range   Sodium 139  137 - 147 mEq/L   Potassium 3.7  3.7 - 5.3 mEq/L   Chloride 105  96 - 112 mEq/L   CO2 27  19 - 32 mEq/L   Glucose, Bld 98  70 - 99 mg/dL   BUN 5 (*) 6 - 23 mg/dL   Creatinine, Ser 8.18  0.50 - 1.35 mg/dL   Calcium 8.3 (*) 8.4 - 10.5 mg/dL   GFR calc non Af Amer >90  >90 mL/min   GFR calc Af Amer >90  >90 mL/min  ETHANOL      Result Value Ref Range   Alcohol, Ethyl (B) <11  0 - 11 mg/dL  URINE RAPID DRUG SCREEN (HOSP PERFORMED)      Result Value Ref Range   Opiates NONE DETECTED  NONE DETECTED   Cocaine NONE DETECTED  NONE DETECTED   Benzodiazepines NONE DETECTED  NONE DETECTED   Amphetamines NONE DETECTED  NONE DETECTED    Tetrahydrocannabinol POSITIVE (*) NONE DETECTED   Barbiturates NONE DETECTED  NONE DETECTED  D-DIMER, QUANTITATIVE      Result Value Ref Range   D-Dimer, Quant 0.28  0.00 - 0.48 ug/mL-FEU  I-STAT TROPOININ, ED      Result Value Ref Range   Troponin i, poc 0.01  0.00 - 0.08 ng/mL   Comment 3            Dg Chest 2 View  11/09/2013   CLINICAL DATA:  Syncope  EXAM: CHEST  2 VIEW  COMPARISON:  None.  FINDINGS: Lungs are clear.  No pleural effusion or pneumothorax.  The heart is normal in size.  Visualized osseous structures are within normal limits.  IMPRESSION: Normal chest radiographs.   Electronically Signed   By: Charline Bills M.D.   On: 11/09/2013 17:27   Ct Head Wo Contrast  11/09/2013   CLINICAL DATA:  LOSS OF CONSCIOUSNESS  EXAM: CT HEAD WITHOUT CONTRAST  CT CERVICAL SPINE WITHOUT CONTRAST  TECHNIQUE: Multidetector CT imaging of the head and cervical spine was performed following the standard protocol without intravenous contrast. Multiplanar CT image reconstructions of the cervical spine were also generated.  COMPARISON:  None.  FINDINGS: CT HEAD FINDINGS  There is no evidence of mass effect, midline shift or extra-axial fluid collections. There is no evidence of a space-occupying lesion or intracranial hemorrhage. There is no evidence of a cortical-based area of acute infarction.  The ventricles and sulci are appropriate for the patient's age. The basal  cisterns are patent.  Visualized portions of the orbits are unremarkable. The visualized portions of the paranasal sinuses and mastoid air cells are unremarkable.  The osseous structures are unremarkable.  CT CERVICAL SPINE FINDINGS  The alignment is anatomic. The vertebral body heights are maintained. There is no acute fracture. There is no static listhesis. The prevertebral soft tissues are normal. The intraspinal soft tissues are not fully imaged on this examination due to poor soft tissue contrast, but there is no gross soft tissue  abnormality.  The disc spaces are maintained.  The visualized portions of the lung apices demonstrate no focal abnormality.  IMPRESSION: 1. No acute intracranial pathology. 2. No acute osseous injury of the cervical spine.   Electronically Signed   By: Elige KoHetal  Patel   On: 11/09/2013 17:37   Ct Cervical Spine Wo Contrast  11/09/2013   CLINICAL DATA:  LOSS OF CONSCIOUSNESS  EXAM: CT HEAD WITHOUT CONTRAST  CT CERVICAL SPINE WITHOUT CONTRAST  TECHNIQUE: Multidetector CT imaging of the head and cervical spine was performed following the standard protocol without intravenous contrast. Multiplanar CT image reconstructions of the cervical spine were also generated.  COMPARISON:  None.  FINDINGS: CT HEAD FINDINGS  There is no evidence of mass effect, midline shift or extra-axial fluid collections. There is no evidence of a space-occupying lesion or intracranial hemorrhage. There is no evidence of a cortical-based area of acute infarction.  The ventricles and sulci are appropriate for the patient's age. The basal cisterns are patent.  Visualized portions of the orbits are unremarkable. The visualized portions of the paranasal sinuses and mastoid air cells are unremarkable.  The osseous structures are unremarkable.  CT CERVICAL SPINE FINDINGS  The alignment is anatomic. The vertebral body heights are maintained. There is no acute fracture. There is no static listhesis. The prevertebral soft tissues are normal. The intraspinal soft tissues are not fully imaged on this examination due to poor soft tissue contrast, but there is no gross soft tissue abnormality.  The disc spaces are maintained.  The visualized portions of the lung apices demonstrate no focal abnormality.  IMPRESSION: 1. No acute intracranial pathology. 2. No acute osseous injury of the cervical spine.   Electronically Signed   By: Elige KoHetal  Patel   On: 11/09/2013 17:37    Patient seen by me. Patient was fine yesterday and this morning. Patient had this afternoon  syncopal [facility] Patient states he hit the back of his head when he fell is complaining of a headache. Patient initially refused EMS transport and drove to work and was brought from work by a friend. Patient does have a past medical history panic attacks and posttraumatic stress disorder. Patient's workup without any specifically acute findings. CT of head was negative CT of cervical spine was negative. Labs to include d-dimer and troponin both negative. Urine drug screen negative electrolytes without sniffing abnormalities. No leukocytosis no significant anemia. Patient however still does not feel right will admit for cardiac monitoring. Hospitalist service will admit.   Vanetta MuldersScott Hannalee Castor, MD 11/09/13 2009

## 2013-11-09 NOTE — ED Notes (Signed)
Pt remains out of dept. In x-ray and CT

## 2013-11-10 LAB — IRON AND TIBC
Iron: 51 ug/dL (ref 42–135)
Iron: 103 ug/dL (ref 42–135)
SATURATION RATIOS: 35 % (ref 20–55)
Saturation Ratios: 18 % — ABNORMAL LOW (ref 20–55)
TIBC: 277 ug/dL (ref 215–435)
TIBC: 291 ug/dL (ref 215–435)
UIBC: 226 ug/dL (ref 125–400)
UIBC: 188 ug/dL (ref 125–400)

## 2013-11-10 LAB — BASIC METABOLIC PANEL
BUN: 6 mg/dL (ref 6–23)
CALCIUM: 8.1 mg/dL — AB (ref 8.4–10.5)
CO2: 26 mEq/L (ref 19–32)
Chloride: 110 mEq/L (ref 96–112)
Creatinine, Ser: 0.8 mg/dL (ref 0.50–1.35)
GFR calc Af Amer: >90 mL/min (ref >90)
GFR calc non Af Amer: >90 mL/min (ref >90)
GLUCOSE: 95 mg/dL (ref 70–99)
Potassium: 4.2 mEq/L (ref 3.7–5.3)
Sodium: 143 mEq/L (ref 137–147)

## 2013-11-10 LAB — GLUCOSE, RANDOM
GLUCOSE: 90 mg/dL (ref 70–99)
Glucose, Bld: 96 mg/dL (ref 70–99)
Glucose, Bld: 100 mg/dL — ABNORMAL HIGH (ref 70–99)
Glucose, Bld: 96 mg/dL (ref 70–99)

## 2013-11-10 LAB — TROPONIN I: Troponin I: <0.3 ng/mL (ref <0.30)

## 2013-11-10 LAB — CBC
HCT: 35.3 % — ABNORMAL LOW (ref 39.0–52.0)
Hemoglobin: 11.2 g/dL — ABNORMAL LOW (ref 13.0–17.0)
MCH: 23.5 pg — ABNORMAL LOW (ref 26.0–34.0)
MCHC: 31.7 g/dL (ref 30.0–36.0)
MCV: 74.2 fL — ABNORMAL LOW (ref 78.0–100.0)
Platelets: 243 10*3/uL (ref 150–400)
RBC: 4.76 MIL/uL (ref 4.22–5.81)
RDW: 14.7 % (ref 11.5–15.5)
WBC: 8.1 10*3/uL (ref 4.0–10.5)

## 2013-11-10 LAB — FERRITIN
FERRITIN: 33 ng/mL (ref 22–322)
Ferritin: 34 ng/mL (ref 22–322)

## 2013-11-10 LAB — FOLATE: Folate: 13.1 ng/mL

## 2013-11-10 LAB — VITAMIN B12: VITAMIN B 12: 599 pg/mL (ref 211–911)

## 2013-11-10 NOTE — Consult Note (Signed)
Reason for Consult: Chest pain/syncope Referring Physician: [facility]  Hunter Riddle is an 33 y.o. male.  HPI: Patient is 33 year old male with past medical history significant for posttraumatic stress disorder, history of panic attacks, history of syncopal episode in the past, marijuana abuse, was admitted yesterday because of syncopal episode. Patient states while standing in the line in [employer] suddenly felt chest heaviness became short of breath dizzy and passed out for a few seconds. Patient denies any palpitations prior to this episode. Patient denies any seizure activity. Denies any weakness in the arms or legs. Denies any incontinence of urine or feces or tongue bite. States had a similar episode about a year ago and then 2008 has never been worked up. History family history of sudden cardiac death. Denies any history of exertional chest pain states exercises regularly without any problems.  Past Medical History  Diagnosis Date  . Panic attack   . PTSD (post-traumatic stress disorder)   . Syncopal episodes     History reviewed. No pertinent past surgical history.  No family history on file.  Social History:  reports that he has never smoked. He does not have any smokeless tobacco history on file. He reports that he does not drink alcohol or use illicit drugs.  Allergies:  Allergies  Allergen Reactions  . Penicillins Anaphylaxis    Medications: I have reviewed the patient's current medications.  Results for orders placed during the hospital encounter of 11/09/13 (from the past 48 hour(s))  CBC WITH DIFFERENTIAL     Status: Abnormal   Collection Time    11/09/13  4:52 PM      Result Value Ref Range   WBC 5.9  4.0 - 10.5 K/uL   RBC 5.15  4.22 - 5.81 MIL/uL   Hemoglobin 12.3 (*) 13.0 - 17.0 g/dL   HCT 37.9 (*) 39.0 - 52.0 %   MCV 73.6 (*) 78.0 - 100.0 fL   MCH 23.9 (*) 26.0 - 34.0 pg   MCHC 32.5  30.0 - 36.0 g/dL   RDW 14.6  11.5 - 15.5 %   Platelets 246  150 -  400 K/uL   Neutrophils Relative % 57  43 - 77 %   Neutro Abs 3.3  1.7 - 7.7 K/uL   Lymphocytes Relative 33  12 - 46 %   Lymphs Abs 1.9  0.7 - 4.0 K/uL   Monocytes Relative 9  3 - 12 %   Monocytes Absolute 0.5  0.1 - 1.0 K/uL   Eosinophils Relative 1  0 - 5 %   Eosinophils Absolute 0.0  0.0 - 0.7 K/uL   Basophils Relative 1  0 - 1 %   Basophils Absolute 0.0  0.0 - 0.1 K/uL  BASIC METABOLIC PANEL     Status: Abnormal   Collection Time    11/09/13  4:52 PM      Result Value Ref Range   Sodium 139  137 - 147 mEq/L   Potassium 3.7  3.7 - 5.3 mEq/L   Chloride 105  96 - 112 mEq/L   CO2 27  19 - 32 mEq/L   Glucose, Bld 98  70 - 99 mg/dL   BUN 5 (*) 6 - 23 mg/dL   Creatinine, Ser 0.81  0.50 - 1.35 mg/dL   Calcium 8.3 (*) 8.4 - 10.5 mg/dL   GFR calc non Af Amer >90  >90 mL/min   GFR calc Af Amer >90  >90 mL/min   Comment: (NOTE)  The eGFR has been calculated using the CKD EPI equation.     This calculation has not been validated in all clinical situations.     eGFR's persistently <90 mL/min signify possible Chronic Kidney     Disease.  ETHANOL     Status: None   Collection Time    11/09/13  4:52 PM      Result Value Ref Range   Alcohol, Ethyl (B) <11  0 - 11 mg/dL   Comment:            LOWEST DETECTABLE LIMIT FOR     SERUM ALCOHOL IS 11 mg/dL     FOR MEDICAL PURPOSES ONLY  URINE RAPID DRUG SCREEN (HOSP PERFORMED)     Status: Abnormal   Collection Time    11/09/13  6:05 PM      Result Value Ref Range   Opiates NONE DETECTED  NONE DETECTED   Cocaine NONE DETECTED  NONE DETECTED   Benzodiazepines NONE DETECTED  NONE DETECTED   Amphetamines NONE DETECTED  NONE DETECTED   Tetrahydrocannabinol POSITIVE (*) NONE DETECTED   Barbiturates NONE DETECTED  NONE DETECTED   Comment:            DRUG SCREEN FOR MEDICAL PURPOSES     ONLY.  IF CONFIRMATION IS NEEDED     FOR ANY PURPOSE, NOTIFY LAB     WITHIN 5 DAYS.                LOWEST DETECTABLE LIMITS     FOR URINE DRUG SCREEN      Drug Class       Cutoff (ng/mL)     Amphetamine      1000     Barbiturate      200     Benzodiazepine   852     Tricyclics       778     Opiates          300     Cocaine          300     THC              50  D-DIMER, QUANTITATIVE     Status: None   Collection Time    11/09/13  6:38 PM      Result Value Ref Range   D-Dimer, Quant 0.28  0.00 - 0.48 ug/mL-FEU   Comment:            AT THE INHOUSE ESTABLISHED CUTOFF     VALUE OF 0.48 ug/mL FEU,     THIS ASSAY HAS BEEN DOCUMENTED     IN THE LITERATURE TO HAVE     A SENSITIVITY AND NEGATIVE     PREDICTIVE VALUE OF AT LEAST     98 TO 99%.  THE TEST RESULT     SHOULD BE CORRELATED WITH     AN ASSESSMENT OF THE CLINICAL     PROBABILITY OF DVT / VTE.  Randolm Idol, ED     Status: None   Collection Time    11/09/13  6:44 PM      Result Value Ref Range   Troponin i, poc 0.01  0.00 - 0.08 ng/mL   Comment 3            Comment: Due to the release kinetics of cTnI,     a negative result within the first hours     of the onset of symptoms does not rule out  myocardial infarction with certainty.     If myocardial infarction is still suspected,     repeat the test at appropriate intervals.  GLUCOSE, RANDOM     Status: Abnormal   Collection Time    11/09/13 11:19 PM      Result Value Ref Range   Glucose, Bld 101 (*) 70 - 99 mg/dL  TROPONIN I     Status: None   Collection Time    11/09/13 11:19 PM      Result Value Ref Range   Troponin I <0.30  <0.30 ng/mL   Comment:            Due to the release kinetics of cTnI,     a negative result within the first hours     of the onset of symptoms does not rule out     myocardial infarction with certainty.     If myocardial infarction is still suspected,     repeat the test at appropriate intervals.  RETICULOCYTES     Status: None   Collection Time    11/09/13 11:19 PM      Result Value Ref Range   Retic Ct Pct 1.6  0.4 - 3.1 %   RBC. 4.72  4.22 - 5.81 MIL/uL   Retic Count, Manual  75.5  19.0 - 186.0 K/uL  BASIC METABOLIC PANEL     Status: Abnormal   Collection Time    11/10/13  1:00 AM      Result Value Ref Range   Sodium 143  137 - 147 mEq/L   Potassium 4.2  3.7 - 5.3 mEq/L   Chloride 110  96 - 112 mEq/L   CO2 26  19 - 32 mEq/L   Glucose, Bld 95  70 - 99 mg/dL   BUN 6  6 - 23 mg/dL   Creatinine, Ser 0.80  0.50 - 1.35 mg/dL   Calcium 8.1 (*) 8.4 - 10.5 mg/dL   GFR calc non Af Amer >90  >90 mL/min   GFR calc Af Amer >90  >90 mL/min   Comment: (NOTE)     The eGFR has been calculated using the CKD EPI equation.     This calculation has not been validated in all clinical situations.     eGFR's persistently <90 mL/min signify possible Chronic Kidney     Disease.  CBC     Status: Abnormal   Collection Time    11/10/13  1:00 AM      Result Value Ref Range   WBC 8.1  4.0 - 10.5 K/uL   RBC 4.76  4.22 - 5.81 MIL/uL   Hemoglobin 11.2 (*) 13.0 - 17.0 g/dL   HCT 35.3 (*) 39.0 - 52.0 %   MCV 74.2 (*) 78.0 - 100.0 fL   MCH 23.5 (*) 26.0 - 34.0 pg   MCHC 31.7  30.0 - 36.0 g/dL   RDW 14.7  11.5 - 15.5 %   Platelets 243  150 - 400 K/uL  GLUCOSE, RANDOM     Status: None   Collection Time    11/10/13  5:05 AM      Result Value Ref Range   Glucose, Bld 96  70 - 99 mg/dL  TROPONIN I     Status: None   Collection Time    11/10/13  5:05 AM      Result Value Ref Range   Troponin I <0.30  <0.30 ng/mL   Comment:  Due to the release kinetics of cTnI,     a negative result within the first hours     of the onset of symptoms does not rule out     myocardial infarction with certainty.     If myocardial infarction is still suspected,     repeat the test at appropriate intervals.  GLUCOSE, RANDOM     Status: Abnormal   Collection Time    11/10/13  8:52 AM      Result Value Ref Range   Glucose, Bld 100 (*) 70 - 99 mg/dL  TROPONIN I     Status: None   Collection Time    11/10/13  8:52 AM      Result Value Ref Range   Troponin I <0.30  <0.30 ng/mL   Comment:             Due to the release kinetics of cTnI,     a negative result within the first hours     of the onset of symptoms does not rule out     myocardial infarction with certainty.     If myocardial infarction is still suspected,     repeat the test at appropriate intervals.    Dg Chest 2 View  11/09/2013   CLINICAL DATA:  Syncope  EXAM: CHEST  2 VIEW  COMPARISON:  None.  FINDINGS: Lungs are clear.  No pleural effusion or pneumothorax.  The heart is normal in size.  Visualized osseous structures are within normal limits.  IMPRESSION: Normal chest radiographs.   Electronically Signed   By: Julian Hy M.D.   On: 11/09/2013 17:27   Ct Head Wo Contrast  11/09/2013   CLINICAL DATA:  LOSS OF CONSCIOUSNESS  EXAM: CT HEAD WITHOUT CONTRAST  CT CERVICAL SPINE WITHOUT CONTRAST  TECHNIQUE: Multidetector CT imaging of the head and cervical spine was performed following the standard protocol without intravenous contrast. Multiplanar CT image reconstructions of the cervical spine were also generated.  COMPARISON:  None.  FINDINGS: CT HEAD FINDINGS  There is no evidence of mass effect, midline shift or extra-axial fluid collections. There is no evidence of a space-occupying lesion or intracranial hemorrhage. There is no evidence of a cortical-based area of acute infarction.  The ventricles and sulci are appropriate for the patient's age. The basal cisterns are patent.  Visualized portions of the orbits are unremarkable. The visualized portions of the paranasal sinuses and mastoid air cells are unremarkable.  The osseous structures are unremarkable.  CT CERVICAL SPINE FINDINGS  The alignment is anatomic. The vertebral body heights are maintained. There is no acute fracture. There is no static listhesis. The prevertebral soft tissues are normal. The intraspinal soft tissues are not fully imaged on this examination due to poor soft tissue contrast, but there is no gross soft tissue abnormality.  The disc spaces are  maintained.  The visualized portions of the lung apices demonstrate no focal abnormality.  IMPRESSION: 1. No acute intracranial pathology. 2. No acute osseous injury of the cervical spine.   Electronically Signed   By: Kathreen Devoid   On: 11/09/2013 17:37   Ct Cervical Spine Wo Contrast  11/09/2013   CLINICAL DATA:  LOSS OF CONSCIOUSNESS  EXAM: CT HEAD WITHOUT CONTRAST  CT CERVICAL SPINE WITHOUT CONTRAST  TECHNIQUE: Multidetector CT imaging of the head and cervical spine was performed following the standard protocol without intravenous contrast. Multiplanar CT image reconstructions of the cervical spine were also generated.  COMPARISON:  None.  FINDINGS: CT HEAD FINDINGS  There is no evidence of mass effect, midline shift or extra-axial fluid collections. There is no evidence of a space-occupying lesion or intracranial hemorrhage. There is no evidence of a cortical-based area of acute infarction.  The ventricles and sulci are appropriate for the patient's age. The basal cisterns are patent.  Visualized portions of the orbits are unremarkable. The visualized portions of the paranasal sinuses and mastoid air cells are unremarkable.  The osseous structures are unremarkable.  CT CERVICAL SPINE FINDINGS  The alignment is anatomic. The vertebral body heights are maintained. There is no acute fracture. There is no static listhesis. The prevertebral soft tissues are normal. The intraspinal soft tissues are not fully imaged on this examination due to poor soft tissue contrast, but there is no gross soft tissue abnormality.  The disc spaces are maintained.  The visualized portions of the lung apices demonstrate no focal abnormality.  IMPRESSION: 1. No acute intracranial pathology. 2. No acute osseous injury of the cervical spine.   Electronically Signed   By: Kathreen Devoid   On: 11/09/2013 17:37    Review of Systems  Constitutional: Negative for fever, chills and weight loss.  HENT: Negative for hearing loss.   Eyes:  Negative for blurred vision, double vision, photophobia and pain.  Respiratory: Positive for shortness of breath. Negative for cough, hemoptysis and sputum production.   Cardiovascular: Positive for chest pain. Negative for palpitations, orthopnea, claudication and leg swelling.  Gastrointestinal: Negative for nausea, vomiting, abdominal pain and diarrhea.  Genitourinary: Negative for dysuria and urgency.  Neurological: Positive for dizziness. Negative for headaches.   Blood pressure 119/70, pulse 52, temperature 98.8 F (37.1 C), temperature source Oral, resp. rate 17, height _0  (1.803 m), weight 82.555 kg (182 lb), SpO2 100.00%. Physical Exam  Constitutional: He is oriented to person, place, and time.  HENT:  Head: Normocephalic and atraumatic.  Eyes: Conjunctivae are normal. Pupils are equal, round, and reactive to light. Left eye exhibits no discharge. No scleral icterus.  Neck: Normal range of motion. Neck supple. No JVD present. No tracheal deviation present. No thyromegaly present.  Cardiovascular: Normal rate and regular rhythm.  Exam reveals no friction rub.   No murmur heard. Respiratory: Effort normal and breath sounds normal. No respiratory distress. He has no wheezes. He has no rales.  GI: Soft. Bowel sounds are normal. He exhibits no distension. There is no tenderness. There is no rebound.  Musculoskeletal: He exhibits no edema and no tenderness.  Neurological: He is alert and oriented to person, place, and time.    Assessment/Plan: Status post syncope rule out cardiac arrhythmias Status post atypical chest pain Posttraumatic stress disorder History of panic attack History of marijuana abuse History of pancreatitis Plan Check 2-D echo Will schedule him for stress test tomorrow May need event monitor as outpatient    Clent Demark 11/10/2013, 12:24 PM

## 2013-11-10 NOTE — Progress Notes (Signed)
PROGRESS NOTE  Hunter Riddle ZOX:[id number] DOB: [birth year] DOA: 11/09/2013 PCP: No PCP Per Patient  Interim summary 33 year old male with a history of panic attacks and PTSD presents to emergency department with syncope. The patient was standing in at [employer] [facility] when he felt some dizziness and began to lean against a counter. The next thing he remembered people were trying to arouse him standing around him. The patient complained of neck and head pain when he arose to. He also complained of some chest discomfort and tingling in his fingers bilateral. There was no bowel or bladder incontinence nor tongue bite although the patient stated that he felt groggy for approximately 5-10 minutes after the syncopal episode. The patient has had recurrent syncope since 2007. He has not sought any medical attention. For the present episode, the patient go to work after refusing EMS. He was subsequently brought to the ED by one of his colleagues. He states that after his syncopal episodes in the past he usually had some chest discomfort and shortness of breath and felt groggy. There is no family history of syncope. The patient has never had any formal evaluation from a cardiac or neurologic standpoint  Assessment/Plan: Syncope -Etiology unclear presently -Consult cardiology--the patient may need event monitor/holter monitor -EEG -EKG sinus rhythm, early repolarization -Troponins negative -Keep patient on telemetry -Orthostatics negative  -d-dimer in the emergency department was negative -CT brain and CT cervical spine negative for acute pathology Atypical chest pain  -cycle troponins -EKG shows early repol Microcytic anemia  -Check iron studies  -Monitor CBC  Marijuana use  -Cessation discussed     Family Communication:   Pt at beside Disposition Plan:   Home when medically stable       Procedures/Studies: Dg Chest 2 View  11/09/2013   CLINICAL DATA:  Syncope  EXAM: CHEST  2 VIEW   COMPARISON:  None.  FINDINGS: Lungs are clear.  No pleural effusion or pneumothorax.  The heart is normal in size.  Visualized osseous structures are within normal limits.  IMPRESSION: Normal chest radiographs.   Electronically Signed   By: Charline BillsSriyesh  Krishnan M.D.   On: 11/09/2013 17:27   Ct Head Wo Contrast  11/09/2013   CLINICAL DATA:  LOSS OF CONSCIOUSNESS  EXAM: CT HEAD WITHOUT CONTRAST  CT CERVICAL SPINE WITHOUT CONTRAST  TECHNIQUE: Multidetector CT imaging of the head and cervical spine was performed following the standard protocol without intravenous contrast. Multiplanar CT image reconstructions of the cervical spine were also generated.  COMPARISON:  None.  FINDINGS: CT HEAD FINDINGS  There is no evidence of mass effect, midline shift or extra-axial fluid collections. There is no evidence of a space-occupying lesion or intracranial hemorrhage. There is no evidence of a cortical-based area of acute infarction.  The ventricles and sulci are appropriate for the patient's age. The basal cisterns are patent.  Visualized portions of the orbits are unremarkable. The visualized portions of the paranasal sinuses and mastoid air cells are unremarkable.  The osseous structures are unremarkable.  CT CERVICAL SPINE FINDINGS  The alignment is anatomic. The vertebral body heights are maintained. There is no acute fracture. There is no static listhesis. The prevertebral soft tissues are normal. The intraspinal soft tissues are not fully imaged on this examination due to poor soft tissue contrast, but there is no gross soft tissue abnormality.  The disc spaces are maintained.  The visualized portions of the lung apices demonstrate no focal abnormality.  IMPRESSION: 1. No acute intracranial pathology. 2. No acute osseous injury of the cervical spine.   Electronically Signed   By: Elige Ko   On: 11/09/2013 17:37   Ct Cervical Spine Wo Contrast  11/09/2013   CLINICAL DATA:  LOSS OF CONSCIOUSNESS  EXAM: CT HEAD WITHOUT  CONTRAST  CT CERVICAL SPINE WITHOUT CONTRAST  TECHNIQUE: Multidetector CT imaging of the head and cervical spine was performed following the standard protocol without intravenous contrast. Multiplanar CT image reconstructions of the cervical spine were also generated.  COMPARISON:  None.  FINDINGS: CT HEAD FINDINGS  There is no evidence of mass effect, midline shift or extra-axial fluid collections. There is no evidence of a space-occupying lesion or intracranial hemorrhage. There is no evidence of a cortical-based area of acute infarction.  The ventricles and sulci are appropriate for the patient's age. The basal cisterns are patent.  Visualized portions of the orbits are unremarkable. The visualized portions of the paranasal sinuses and mastoid air cells are unremarkable.  The osseous structures are unremarkable.  CT CERVICAL SPINE FINDINGS  The alignment is anatomic. The vertebral body heights are maintained. There is no acute fracture. There is no static listhesis. The prevertebral soft tissues are normal. The intraspinal soft tissues are not fully imaged on this examination due to poor soft tissue contrast, but there is no gross soft tissue abnormality.  The disc spaces are maintained.  The visualized portions of the lung apices demonstrate no focal abnormality.  IMPRESSION: 1. No acute intracranial pathology. 2. No acute osseous injury of the cervical spine.   Electronically Signed   By: Elige Ko   On: 11/09/2013 17:37         Subjective: Patient denies fevers, chills, headache, chest pain, dyspnea, nausea, vomiting, diarrhea, abdominal pain, dysuria, hematuria   Objective: Filed Vitals:   11/09/13 2121 11/10/13 0000 11/10/13 0400 11/10/13 0749  BP: 102/82 103/64 125/96 124/78  Pulse: 66 53 57 56  Temp:  97.7 F (36.5 C) 97.9 F (36.6 C) 97.8 F (36.6 C)  TempSrc:  Oral  Oral  Resp:  16 16 18   Height:      Weight:      SpO2:  100% 100% 100%    Intake/Output Summary (Last 24  hours) at 11/10/13 3009 Last data filed at 11/10/13 0500  Gross per 24 hour  Intake   1778 ml  Output      0 ml  Net   1778 ml   Weight change:  Exam:   General:  Pt is alert, follows commands appropriately, not in acute distress  HEENT: No icterus, No thrush,[place]/AT  Cardiovascular: RRR, S1/S2, no rubs, no gallops  Respiratory: CTA bilaterally, no wheezing, no crackles, no rhonchi  Abdomen: Soft/+BS, non tender, non distended, no guarding  Extremities: No edema, No lymphangitis, No petechiae, No rashes, no synovitis  Data Reviewed: Basic Metabolic Panel:  Recent Labs Lab 11/09/13 1652 11/09/13 2319 11/10/13 0100 11/10/13 0505  NA 139  --  143  --   K 3.7  --  4.2  --   CL 105  --  110  --   CO2 27  --  26  --   GLUCOSE 98 101* 95 96  BUN 5*  --  6  --   CREATININE 0.81  --  0.80  --   CALCIUM 8.3*  --  8.1*  --    Liver Function Tests: No results found for this basename: AST, ALT, ALKPHOS, BILITOT, PROT,  ALBUMIN,  in the last 168 hours No results found for this basename: LIPASE, AMYLASE,  in the last 168 hours No results found for this basename: AMMONIA,  in the last 168 hours CBC:  Recent Labs Lab 11/09/13 1652 11/10/13 0100  WBC 5.9 8.1  NEUTROABS 3.3  --   HGB 12.3* 11.2*  HCT 37.9* 35.3*  MCV 73.6* 74.2*  PLT 246 243   Cardiac Enzymes:  Recent Labs Lab 11/09/13 2319 11/10/13 0505  TROPONINI <0.30 <0.30   BNP: No components found with this basename: POCBNP,  CBG: No results found for this basename: GLUCAP,  in the last 168 hours  No results found for this or any previous visit (from the past 240 hour(s)).   Scheduled Meds: . sodium chloride  3 mL Intravenous Q12H   Continuous Infusions: . sodium chloride 100 mL/hr at 11/10/13 0500     Catarina Hartshorn, DO  [facility] Pager [phone]  If 7PM-7AM, please contact night-coverage [email or web address] Password [email or web address] 11/10/2013, 9:03 AM   LOS: 1 day

## 2013-11-10 NOTE — Progress Notes (Signed)
Utilization Review Completed.  

## 2013-11-11 ENCOUNTER — Observation Stay (HOSPITAL_COMMUNITY): Payer: Non-veteran care

## 2013-11-11 MED ORDER — TECHNETIUM TC 99M SESTAMIBI GENERIC - CARDIOLITE
10.0000 | Freq: Once | INTRAVENOUS | Status: AC | PRN
  Administered 2013-11-11: 10 via INTRAVENOUS

## 2013-11-11 MED ORDER — TECHNETIUM TC 99M SESTAMIBI - CARDIOLITE
30.0000 | Freq: Once | INTRAVENOUS | Status: AC | PRN
  Administered 2013-11-11: 13:00:00 30 via INTRAVENOUS

## 2013-11-11 MED ORDER — ENOXAPARIN SODIUM 40 MG/0.4ML ~~LOC~~ SOLN
40.0000 mg | SUBCUTANEOUS | Status: DC
  Filled 2013-11-11 (×2): qty 0.4

## 2013-11-11 NOTE — Progress Notes (Signed)
Subjective:  Patient denies any chest pain or shortness of breath.  Denies any palpitation.  No significant arrhythmias noted.  Had nuclear stress test earlier showed no evidence of reversible ischemia with normal LV systolic function  Objective:  Vital Signs in the last 24 hours: Temp:  [97.7 F (36.5 C)-99.1 F (37.3 C)] 98.5 F (36.9 C) (06/01 1719) Pulse Rate:  [50-155] 69 (06/01 1719) Resp:  [16] 16 (06/01 1719) BP: (117-168)/(70-91) 125/74 mmHg (06/01 1719) SpO2:  [99 %-100 %] 100 % (06/01 1719) Weight:  [81.103 kg (178 lb 12.8 oz)] 81.103 kg (178 lb 12.8 oz) (06/01 0653)  Intake/Output from previous day: 05/31 0701 - 06/01 0700 In: 2666.7 [P.O.:360; I.V.:2306.7] Out: 1950 [Urine:1950] Intake/Output from this shift: Total I/O In: -  Out: 375 [Urine:375]  Physical Exam: Neck: no adenopathy, no carotid bruit, no JVD and supple, symmetrical, trachea midline Lungs: clear to auscultation bilaterally Heart: regular rate and rhythm, S1, S2 normal, no murmur, click, rub or gallop Abdomen: soft, non-tender; bowel sounds normal; no masses,  no organomegaly Extremities: extremities normal, atraumatic, no cyanosis or edema  Lab Results:  Recent Labs  11/09/13 1652 11/10/13 0100  WBC 5.9 8.1  HGB 12.3* 11.2*  PLT 246 243    Recent Labs  11/09/13 1652  11/10/13 0100  11/10/13 1340 11/10/13 1640  NA 139  --  143  --   --   --   K 3.7  --  4.2  --   --   --   CL 105  --  110  --   --   --   CO2 27  --  26  --   --   --   GLUCOSE 98  < > 95  < > 96 90  BUN 5*  --  6  --   --   --   CREATININE 0.81  --  0.80  --   --   --   < > = values in this interval not displayed.  Recent Labs  11/10/13 0505 11/10/13 0852  TROPONINI <0.30 <0.30   Hepatic Function Panel No results found for this basename: PROT, ALBUMIN, AST, ALT, ALKPHOS, BILITOT, BILIDIR, IBILI,  in the last 72 hours No results found for this basename: CHOL,  in the last 72 hours No results found for this  basename: PROTIME,  in the last 72 hours  Imaging: Imaging results have been reviewed and Ct Head Wo Contrast  11/09/2013   CLINICAL DATA:  LOSS OF CONSCIOUSNESS  EXAM: CT HEAD WITHOUT CONTRAST  CT CERVICAL SPINE WITHOUT CONTRAST  TECHNIQUE: Multidetector CT imaging of the head and cervical spine was performed following the standard protocol without intravenous contrast. Multiplanar CT image reconstructions of the cervical spine were also generated.  COMPARISON:  None.  FINDINGS: CT HEAD FINDINGS  There is no evidence of mass effect, midline shift or extra-axial fluid collections. There is no evidence of a space-occupying lesion or intracranial hemorrhage. There is no evidence of a cortical-based area of acute infarction.  The ventricles and sulci are appropriate for the patient's age. The basal cisterns are patent.  Visualized portions of the orbits are unremarkable. The visualized portions of the paranasal sinuses and mastoid air cells are unremarkable.  The osseous structures are unremarkable.  CT CERVICAL SPINE FINDINGS  The alignment is anatomic. The vertebral body heights are maintained. There is no acute fracture. There is no static listhesis. The prevertebral soft tissues are normal. The intraspinal soft tissues are  not fully imaged on this examination due to poor soft tissue contrast, but there is no gross soft tissue abnormality.  The disc spaces are maintained.  The visualized portions of the lung apices demonstrate no focal abnormality.  IMPRESSION: 1. No acute intracranial pathology. 2. No acute osseous injury of the cervical spine.   Electronically Signed   By: Elige Ko   On: 11/09/2013 17:37   Ct Cervical Spine Wo Contrast  11/09/2013   CLINICAL DATA:  LOSS OF CONSCIOUSNESS  EXAM: CT HEAD WITHOUT CONTRAST  CT CERVICAL SPINE WITHOUT CONTRAST  TECHNIQUE: Multidetector CT imaging of the head and cervical spine was performed following the standard protocol without intravenous contrast.  Multiplanar CT image reconstructions of the cervical spine were also generated.  COMPARISON:  None.  FINDINGS: CT HEAD FINDINGS  There is no evidence of mass effect, midline shift or extra-axial fluid collections. There is no evidence of a space-occupying lesion or intracranial hemorrhage. There is no evidence of a cortical-based area of acute infarction.  The ventricles and sulci are appropriate for the patient's age. The basal cisterns are patent.  Visualized portions of the orbits are unremarkable. The visualized portions of the paranasal sinuses and mastoid air cells are unremarkable.  The osseous structures are unremarkable.  CT CERVICAL SPINE FINDINGS  The alignment is anatomic. The vertebral body heights are maintained. There is no acute fracture. There is no static listhesis. The prevertebral soft tissues are normal. The intraspinal soft tissues are not fully imaged on this examination due to poor soft tissue contrast, but there is no gross soft tissue abnormality.  The disc spaces are maintained.  The visualized portions of the lung apices demonstrate no focal abnormality.  IMPRESSION: 1. No acute intracranial pathology. 2. No acute osseous injury of the cervical spine.   Electronically Signed   By: Elige Ko   On: 11/09/2013 17:37   Nm Myocar Multi W/spect W/wall Motion / Ef  11/11/2013   CLINICAL DATA:  Chest pain.  Syncope.  Anemia.  EXAM: MYOCARDIAL IMAGING WITH SPECT (REST AND EXERCISE)  GATED LEFT VENTRICULAR WALL MOTION STUDY  LEFT VENTRICULAR EJECTION FRACTION  TECHNIQUE: Standard myocardial SPECT imaging was performed after resting intravenous injection of 10 mCi Tc-6m sestamibi. Subsequently, exercise tolerance test was performed by the patient under the supervision of the Cardiology staff. At peak-stress, 30 mCi Tc-71m sestamibi was injected intravenously and standard myocardial SPECT imaging was performed. Quantitative gated imaging was also performed to evaluate left ventricular wall  motion, and estimate left ventricular ejection fraction.  COMPARISON:  Two-view chest x-ray 11/09/2013.  FINDINGS: Normal cardiac fusion is evident at rest. There is no reversible ischemia with stress.  Normal contractility and wall motion is evident.  The estimated end-diastolic volume is 105 mL. The estimated systolic volume is 42 mL. The calculated ejection fraction is 60%, within normal limits  IMPRESSION: 1. Normal nuclear medicine rest and exercise perfusion exam. 2. Normal contractility and wall motion. 3. Normal function with an estimated ejection fraction of 60%.   Electronically Signed   By: Gennette Pac M.D.   On: 11/11/2013 15:00    Cardiac Studies:  Assessment/Plan:  Status post syncope rule out cardiac arrhythmias  Status post atypical chest pain MI ruled out negative stress Myoview Posttraumatic stress disorder  History of panic attack  History of marijuana abuse  History of pancreatitis  Plan Okay to discharge from cardiac point of view we will arrange for an event monitor as outpatient as needed follow-up  with me in 2 weeks  LOS: 2 days    Robynn Pane 11/11/2013, 5:22 PM

## 2013-11-11 NOTE — Progress Notes (Signed)
Echo Lab  2D Echocardiogram completed.  Hunter Riddle [name] [name], RDCS 11/11/2013 8:43 AM

## 2013-11-11 NOTE — Progress Notes (Signed)
EEG Completed; Results Pending  

## 2013-11-11 NOTE — Progress Notes (Signed)
PROGRESS NOTE  Nyan Whan JQB:[id number] DOB: 11-01-1980 DOA: 11/09/2013 PCP: No PCP Per Patient  Interim summary  33 year old male with a history of panic attacks and PTSD presents to emergency department with syncope. The patient was standing in at [facility] when he felt some dizziness and began to lean against a counter. The next thing he remembered people were trying to arouse him standing around him. The patient complained of neck and head pain when he arose to. He also complained of some chest discomfort and tingling in his fingers bilateral. There was no bowel or bladder incontinence nor tongue bite although the patient stated that he felt groggy for approximately 5-10 minutes after the syncopal episode. The patient has had recurrent syncope since 2007. He has not sought any medical attention. For the present episode, the patient go to work after refusing EMS. He was subsequently brought to the ED by one of his colleagues. He states that after his syncopal episodes in the past he usually had some chest discomfort and shortness of breath and felt groggy. There is no family history of syncope. The patient has never had any formal evaluation from a cardiac or neurologic standpoint  Assessment/Plan:  Syncope  -Etiology unclear presently  -Consulted cardiology--will set up event monitor as outpt -myoview--neg for inducible ischemia--EF60% -EEG--pending -EKG sinus rhythm, early repolarization  -Troponins negativex3 -Keep patient on telemetry  -Orthostatics negative  -d-dimer in the emergency department was negative  -CT brain and CT cervical spine negative for acute pathology  Atypical chest pain  -cycle troponins  -EKG shows early repol  Microcytic anemia  -Check iron studies--iron saturation 35%, ferritin 34 -Will need further evaluation in the outpatient setting  -Monitor CBC  Marijuana use  -Cessation discussed   Family Communication:   wife at beside  updated Disposition Plan:   Home when medically stable       Procedures/Studies: Dg Chest 2 View  11/09/2013   CLINICAL DATA:  Syncope  EXAM: CHEST  2 VIEW  COMPARISON:  None.  FINDINGS: Lungs are clear.  No pleural effusion or pneumothorax.  The heart is normal in size.  Visualized osseous structures are within normal limits.  IMPRESSION: Normal chest radiographs.   Electronically Signed   By: Charline Bills M.D.   On: 11/09/2013 17:27   Ct Head Wo Contrast  11/09/2013   CLINICAL DATA:  LOSS OF CONSCIOUSNESS  EXAM: CT HEAD WITHOUT CONTRAST  CT CERVICAL SPINE WITHOUT CONTRAST  TECHNIQUE: Multidetector CT imaging of the head and cervical spine was performed following the standard protocol without intravenous contrast. Multiplanar CT image reconstructions of the cervical spine were also generated.  COMPARISON:  None.  FINDINGS: CT HEAD FINDINGS  There is no evidence of mass effect, midline shift or extra-axial fluid collections. There is no evidence of a space-occupying lesion or intracranial hemorrhage. There is no evidence of a cortical-based area of acute infarction.  The ventricles and sulci are appropriate for the patient's age. The basal cisterns are patent.  Visualized portions of the orbits are unremarkable. The visualized portions of the paranasal sinuses and mastoid air cells are unremarkable.  The osseous structures are unremarkable.  CT CERVICAL SPINE FINDINGS  The alignment is anatomic. The vertebral body heights are maintained. There is no acute fracture. There is no static listhesis. The prevertebral soft tissues are normal. The intraspinal soft tissues are not fully imaged on this examination due to poor soft tissue contrast, but there  is no gross soft tissue abnormality.  The disc spaces are maintained.  The visualized portions of the lung apices demonstrate no focal abnormality.  IMPRESSION: 1. No acute intracranial pathology. 2. No acute osseous injury of the cervical spine.    Electronically Signed   By: Elige KoHetal  Patel   On: 11/09/2013 17:37   Ct Cervical Spine Wo Contrast  11/09/2013   CLINICAL DATA:  LOSS OF CONSCIOUSNESS  EXAM: CT HEAD WITHOUT CONTRAST  CT CERVICAL SPINE WITHOUT CONTRAST  TECHNIQUE: Multidetector CT imaging of the head and cervical spine was performed following the standard protocol without intravenous contrast. Multiplanar CT image reconstructions of the cervical spine were also generated.  COMPARISON:  None.  FINDINGS: CT HEAD FINDINGS  There is no evidence of mass effect, midline shift or extra-axial fluid collections. There is no evidence of a space-occupying lesion or intracranial hemorrhage. There is no evidence of a cortical-based area of acute infarction.  The ventricles and sulci are appropriate for the patient's age. The basal cisterns are patent.  Visualized portions of the orbits are unremarkable. The visualized portions of the paranasal sinuses and mastoid air cells are unremarkable.  The osseous structures are unremarkable.  CT CERVICAL SPINE FINDINGS  The alignment is anatomic. The vertebral body heights are maintained. There is no acute fracture. There is no static listhesis. The prevertebral soft tissues are normal. The intraspinal soft tissues are not fully imaged on this examination due to poor soft tissue contrast, but there is no gross soft tissue abnormality.  The disc spaces are maintained.  The visualized portions of the lung apices demonstrate no focal abnormality.  IMPRESSION: 1. No acute intracranial pathology. 2. No acute osseous injury of the cervical spine.   Electronically Signed   By: Elige KoHetal  Patel   On: 11/09/2013 17:37   Nm Myocar Multi W/spect W/wall Motion / Ef  11/11/2013   CLINICAL DATA:  Chest pain.  Syncope.  Anemia.  EXAM: MYOCARDIAL IMAGING WITH SPECT (REST AND EXERCISE)  GATED LEFT VENTRICULAR WALL MOTION STUDY  LEFT VENTRICULAR EJECTION FRACTION  TECHNIQUE: Standard myocardial SPECT imaging was performed after resting  intravenous injection of 10 mCi Tc-10760m sestamibi. Subsequently, exercise tolerance test was performed by the patient under the supervision of the Cardiology staff. At peak-stress, 30 mCi Tc-4260m sestamibi was injected intravenously and standard myocardial SPECT imaging was performed. Quantitative gated imaging was also performed to evaluate left ventricular wall motion, and estimate left ventricular ejection fraction.  COMPARISON:  Two-view chest x-ray 11/09/2013.  FINDINGS: Normal cardiac fusion is evident at rest. There is no reversible ischemia with stress.  Normal contractility and wall motion is evident.  The estimated end-diastolic volume is 105 mL. The estimated systolic volume is 42 mL. The calculated ejection fraction is 60%, within normal limits  IMPRESSION: 1. Normal nuclear medicine rest and exercise perfusion exam. 2. Normal contractility and wall motion. 3. Normal function with an estimated ejection fraction of 60%.   Electronically Signed   By: Gennette Pachris  Mattern M.D.   On: 11/11/2013 15:00         Subjective: Patient denies fevers, chills, headache, chest pain, dyspnea, nausea, vomiting, diarrhea, abdominal pain, dysuria, hematuria   Objective: Filed Vitals:   11/11/13 1344 11/11/13 1346 11/11/13 1347 11/11/13 1719  BP: 168/90 159/91  125/74  Pulse: 67 67 63 69  Temp:    98.5 F (36.9 C)  TempSrc:    Oral  Resp:    16  Height:      Weight:  SpO2:    100%    Intake/Output Summary (Last 24 hours) at 11/11/13 1828 Last data filed at 11/11/13 0759  Gross per 24 hour  Intake 2546.67 ml  Output   1425 ml  Net 1121.67 ml   Weight change: -1.452 kg (-3 lb 3.2 oz) Exam:   General:  Pt is alert, follows commands appropriately, not in acute distress  HEENT: No icterus, No thrush, [place]/AT  Cardiovascular: RRR, S1/S2, no rubs, no gallops  Respiratory: CTA bilaterally, no wheezing, no crackles, no rhonchi  Abdomen: Soft/+BS, non tender, non distended, no  guarding  Extremities: No edema, No lymphangitis, No petechiae, No rashes, no synovitis  Data Reviewed: Basic Metabolic Panel:  Recent Labs Lab 11/09/13 1652  11/10/13 0100 11/10/13 0505 11/10/13 0852 11/10/13 1340 11/10/13 1640  NA 139  --  143  --   --   --   --   K 3.7  --  4.2  --   --   --   --   CL 105  --  110  --   --   --   --   CO2 27  --  26  --   --   --   --   GLUCOSE 98  < > 95 96 100* 96 90  BUN 5*  --  6  --   --   --   --   CREATININE 0.81  --  0.80  --   --   --   --   CALCIUM 8.3*  --  8.1*  --   --   --   --   < > = values in this interval not displayed. Liver Function Tests: No results found for this basename: AST, ALT, ALKPHOS, BILITOT, PROT, ALBUMIN,  in the last 168 hours No results found for this basename: LIPASE, AMYLASE,  in the last 168 hours No results found for this basename: AMMONIA,  in the last 168 hours CBC:  Recent Labs Lab 11/09/13 1652 11/10/13 0100  WBC 5.9 8.1  NEUTROABS 3.3  --   HGB 12.3* 11.2*  HCT 37.9* 35.3*  MCV 73.6* 74.2*  PLT 246 243   Cardiac Enzymes:  Recent Labs Lab 11/09/13 2319 11/10/13 0505 11/10/13 0852  TROPONINI <0.30 <0.30 <0.30   BNP: No components found with this basename: POCBNP,  CBG: No results found for this basename: GLUCAP,  in the last 168 hours  No results found for this or any previous visit (from the past 240 hour(s)).   Scheduled Meds: . enoxaparin (LOVENOX) injection  40 mg Subcutaneous Q24H  . sodium chloride  3 mL Intravenous Q12H   Continuous Infusions: . sodium chloride 100 mL/hr at 11/11/13 1448     Catarina Hartshorn, DO  [facility] Pager [phone]  If 7PM-7AM, please contact night-coverage [email or web address] Password [email or web address] 11/11/2013, 6:28 PM   LOS: 2 days

## 2013-11-12 LAB — CBC
HCT: 39 % (ref 39.0–52.0)
HEMOGLOBIN: 12.9 g/dL — AB (ref 13.0–17.0)
MCH: 23.9 pg — AB (ref 26.0–34.0)
MCHC: 33.1 g/dL (ref 30.0–36.0)
MCV: 72.4 fL — ABNORMAL LOW (ref 78.0–100.0)
PLATELETS: 299 10*3/uL (ref 150–400)
RBC: 5.39 MIL/uL (ref 4.22–5.81)
RDW: 14.8 % (ref 11.5–15.5)
WBC: 6.3 10*3/uL (ref 4.0–10.5)

## 2013-11-12 NOTE — Discharge Summary (Signed)
Physician Discharge Summary  Hunter ShuckBrian Riddle ZOX:[id number] DOB: 1980-09-15 DOA: 11/09/2013  PCP: No PCP Per Patient  Admit date: 11/09/2013 Discharge date: 11/12/2013  Recommendations for Outpatient Follow-up:  1. Pt will need to follow up with PCP in 2 weeks post discharge 2. Please also check CBC to evaluate Hg and Hct levels 3. Please followup on results of EEG   Discharge Diagnoses:  Syncope  -Etiology unclear presently  -Consulted cardiology--will set up event monitor as outpt  -myoview--neg for inducible ischemia--EF60%  -EEG--results pending at time of d/c -Echo--EF 55-60%, no wall motion abnormality -EKG sinus rhythm, early repolarization  -Troponins negativex3  -Kept patient on telemetry--no ectopy noted -Orthostatics negative  -d-dimer in the emergency department was negative  -CT brain and CT cervical spine negative for acute pathology  Atypical chest pain  -cycle troponins--negative x3 -EKG shows early repol  Microcytic anemia  -Check iron studies--iron saturation 35%, ferritin 34  -Will need further evaluation in the outpatient setting  -Monitor CBC--Hgb stable Marijuana use  -Cessation discussed    Discharge Condition: stable  Disposition:  Follow-up Information   Follow up with Hunter PaneHARWANI,MOHAN N, MD In 2 weeks.   Specialty:  Cardiology   Contact information:   [address] [place] [address] [phone]     home  Diet:regular Wt Readings from Last 3 Encounters:  11/12/13 79.334 kg (174 lb 14.4 oz)    History of present illness:  Interim summary  33 year old male with a history of panic attacks and PTSD presents to emergency department with syncope. The patient was standing in [facility] [employer] when he felt some dizziness and began to lean against a counter. The next thing he remembered people were trying to arouse him standing around him. The patient complained of neck and head pain when he arose to. He also complained of some chest  discomfort and tingling in his fingers bilateral. There was no bowel or bladder incontinence nor tongue bite although the patient stated that he felt groggy for approximately 5-10 minutes after the syncopal episode. The patient has had recurrent syncope since 2007. He has not sought any medical attention. For the present episode, the patient go to work after refusing EMS. He was subsequently brought to the ED by one of his colleagues. He states that after his syncopal episodes in the past he usually had some chest discomfort and shortness of breath and felt groggy. There is no family history of syncope. The patient has never had any formal evaluation from a cardiac or neurologic standpoint     Consultants: cardiology  Discharge Exam: Filed Vitals:   11/12/13 0753  BP: 122/75  Pulse: 57  Temp: 98.2 F (36.8 C)  Resp:    Filed Vitals:   11/11/13 2000 11/12/13 0000 11/12/13 0400 11/12/13 0753  BP: 138/80 110/83 143/93 122/75  Pulse: 58 60 69 57  Temp: 98.3 F (36.8 C) 98 F (36.7 C) 98.2 F (36.8 C) 98.2 F (36.8 C)  TempSrc: Oral Oral Oral Oral  Resp: 16 20 20    Height:      Weight:   79.334 kg (174 lb 14.4 oz)   SpO2: 100% 100% 100% 100%   General: A&O x 3, NAD, pleasant, cooperative Cardiovascular: RRR, no rub, no gallop, no S3 Respiratory: CTAB, no wheeze, no rhonchi Abdomen:soft, nontender, nondistended, positive bowel sounds Extremities: No edema, No lymphangitis, no petechiae  Discharge Instructions      Discharge Instructions   Diet general    Complete by:  As directed      Increase activity slowly    Complete by:  As directed             Medication List    Notice   You have not been prescribed any medications.       The results of significant diagnostics from this hospitalization (including imaging, microbiology, ancillary and laboratory) are listed below for reference.    Significant Diagnostic Studies: Dg Chest 2 View  11/09/2013   CLINICAL DATA:   Syncope  EXAM: CHEST  2 VIEW  COMPARISON:  None.  FINDINGS: Lungs are clear.  No pleural effusion or pneumothorax.  The heart is normal in size.  Visualized osseous structures are within normal limits.  IMPRESSION: Normal chest radiographs.   Electronically Signed   By: Charline Bills M.D.   On: 11/09/2013 17:27   Ct Head Wo Contrast  11/09/2013   CLINICAL DATA:  LOSS OF CONSCIOUSNESS  EXAM: CT HEAD WITHOUT CONTRAST  CT CERVICAL SPINE WITHOUT CONTRAST  TECHNIQUE: Multidetector CT imaging of the head and cervical spine was performed following the standard protocol without intravenous contrast. Multiplanar CT image reconstructions of the cervical spine were also generated.  COMPARISON:  None.  FINDINGS: CT HEAD FINDINGS  There is no evidence of mass effect, midline shift or extra-axial fluid collections. There is no evidence of a space-occupying lesion or intracranial hemorrhage. There is no evidence of a cortical-based area of acute infarction.  The ventricles and sulci are appropriate for the patient's age. The basal cisterns are patent.  Visualized portions of the orbits are unremarkable. The visualized portions of the paranasal sinuses and mastoid air cells are unremarkable.  The osseous structures are unremarkable.  CT CERVICAL SPINE FINDINGS  The alignment is anatomic. The vertebral body heights are maintained. There is no acute fracture. There is no static listhesis. The prevertebral soft tissues are normal. The intraspinal soft tissues are not fully imaged on this examination due to poor soft tissue contrast, but there is no gross soft tissue abnormality.  The disc spaces are maintained.  The visualized portions of the lung apices demonstrate no focal abnormality.  IMPRESSION: 1. No acute intracranial pathology. 2. No acute osseous injury of the cervical spine.   Electronically Signed   By: Elige Ko   On: 11/09/2013 17:37   Ct Cervical Spine Wo Contrast  11/09/2013   CLINICAL DATA:  LOSS OF  CONSCIOUSNESS  EXAM: CT HEAD WITHOUT CONTRAST  CT CERVICAL SPINE WITHOUT CONTRAST  TECHNIQUE: Multidetector CT imaging of the head and cervical spine was performed following the standard protocol without intravenous contrast. Multiplanar CT image reconstructions of the cervical spine were also generated.  COMPARISON:  None.  FINDINGS: CT HEAD FINDINGS  There is no evidence of mass effect, midline shift or extra-axial fluid collections. There is no evidence of a space-occupying lesion or intracranial hemorrhage. There is no evidence of a cortical-based area of acute infarction.  The ventricles and sulci are appropriate for the patient's age. The basal cisterns are patent.  Visualized portions of the orbits are unremarkable. The visualized portions of the paranasal sinuses and mastoid air cells are unremarkable.  The osseous structures are unremarkable.  CT CERVICAL SPINE FINDINGS  The alignment is anatomic. The vertebral body heights are maintained. There is no acute fracture. There is no static listhesis. The prevertebral soft tissues are normal. The intraspinal soft tissues are not fully imaged on this examination due to poor soft tissue contrast, but there is  no gross soft tissue abnormality.  The disc spaces are maintained.  The visualized portions of the lung apices demonstrate no focal abnormality.  IMPRESSION: 1. No acute intracranial pathology. 2. No acute osseous injury of the cervical spine.   Electronically Signed   By: Elige Ko   On: 11/09/2013 17:37   Nm Myocar Multi W/spect W/wall Motion / Ef  11/11/2013   CLINICAL DATA:  Chest pain.  Syncope.  Anemia.  EXAM: MYOCARDIAL IMAGING WITH SPECT (REST AND EXERCISE)  GATED LEFT VENTRICULAR WALL MOTION STUDY  LEFT VENTRICULAR EJECTION FRACTION  TECHNIQUE: Standard myocardial SPECT imaging was performed after resting intravenous injection of 10 mCi Tc-31m sestamibi. Subsequently, exercise tolerance test was performed by the patient under the supervision of  the Cardiology staff. At peak-stress, 30 mCi Tc-73m sestamibi was injected intravenously and standard myocardial SPECT imaging was performed. Quantitative gated imaging was also performed to evaluate left ventricular wall motion, and estimate left ventricular ejection fraction.  COMPARISON:  Two-view chest x-ray 11/09/2013.  FINDINGS: Normal cardiac fusion is evident at rest. There is no reversible ischemia with stress.  Normal contractility and wall motion is evident.  The estimated end-diastolic volume is 105 mL. The estimated systolic volume is 42 mL. The calculated ejection fraction is 60%, within normal limits  IMPRESSION: 1. Normal nuclear medicine rest and exercise perfusion exam. 2. Normal contractility and wall motion. 3. Normal function with an estimated ejection fraction of 60%.   Electronically Signed   By: Gennette Pac M.D.   On: 11/11/2013 15:00     Microbiology: No results found for this or any previous visit (from the past 240 hour(s)).   Labs: Basic Metabolic Panel:  Recent Labs Lab 11/09/13 1652  11/10/13 0100 11/10/13 0505 11/10/13 0852 11/10/13 1340 11/10/13 1640  NA 139  --  143  --   --   --   --   K 3.7  --  4.2  --   --   --   --   CL 105  --  110  --   --   --   --   CO2 27  --  26  --   --   --   --   GLUCOSE 98  < > 95 96 100* 96 90  BUN 5*  --  6  --   --   --   --   CREATININE 0.81  --  0.80  --   --   --   --   CALCIUM 8.3*  --  8.1*  --   --   --   --   < > = values in this interval not displayed. Liver Function Tests: No results found for this basename: AST, ALT, ALKPHOS, BILITOT, PROT, ALBUMIN,  in the last 168 hours No results found for this basename: LIPASE, AMYLASE,  in the last 168 hours No results found for this basename: AMMONIA,  in the last 168 hours CBC:  Recent Labs Lab 11/09/13 1652 11/10/13 0100 11/12/13 0530  WBC 5.9 8.1 6.3  NEUTROABS 3.3  --   --   HGB 12.3* 11.2* 12.9*  HCT 37.9* 35.3* 39.0  MCV 73.6* 74.2* 72.4*  PLT 246  243 299   Cardiac Enzymes:  Recent Labs Lab 11/09/13 2319 11/10/13 0505 11/10/13 0852  TROPONINI <0.30 <0.30 <0.30   BNP: No components found with this basename: POCBNP,  CBG: No results found for this basename: GLUCAP,  in the last 168 hours  Time  coordinating discharge:  Greater than 30 minutes  Signed:  Catarina Hartshorn, DO [facility] Pager: [phone] 11/12/2013, 10:57 AM

## 2013-11-12 NOTE — Progress Notes (Signed)
Subjective:  Denies any chest pain shortness of breath or palpitations. No further episodes of syncope. 2-D echo showed normal wall motion and good LV systolic function and no evidence of any valvular heart disease  Objective:  Vital Signs in the last 24 hours: Temp:  [98 F (36.7 C)-98.5 F (36.9 C)] 98.2 F (36.8 C) (06/02 0753) Pulse Rate:  [52-155] 57 (06/02 0753) Resp:  [16-20] 20 (06/02 0400) BP: (110-168)/(70-93) 122/75 mmHg (06/02 0753) SpO2:  [100 %] 100 % (06/02 0753) Weight:  [79.334 kg (174 lb 14.4 oz)] 79.334 kg (174 lb 14.4 oz) (06/02 0400)  Intake/Output from previous day: 06/01 0701 - 06/02 0700 In: -  Out: 1025 [Urine:1025] Intake/Output from this shift: Total I/O In: 120 [P.O.:120] Out: -   Physical Exam: Neck: no adenopathy, no carotid bruit, no JVD and supple, symmetrical, trachea midline Lungs: clear to auscultation bilaterally Heart: regular rate and rhythm, S1, S2 normal, no murmur, click, rub or gallop Abdomen: soft, non-tender; bowel sounds normal; no masses,  no organomegaly Extremities: extremities normal, atraumatic, no cyanosis or edema  Lab Results:  Recent Labs  11/10/13 0100 11/12/13 0530  WBC 8.1 6.3  HGB 11.2* 12.9*  PLT 243 299    Recent Labs  11/09/13 1652  11/10/13 0100  11/10/13 1340 11/10/13 1640  NA 139  --  143  --   --   --   K 3.7  --  4.2  --   --   --   CL 105  --  110  --   --   --   CO2 27  --  26  --   --   --   GLUCOSE 98  < > 95  < > 96 90  BUN 5*  --  6  --   --   --   CREATININE 0.81  --  0.80  --   --   --   < > = values in this interval not displayed.  Recent Labs  11/10/13 0505 11/10/13 0852  TROPONINI <0.30 <0.30   Hepatic Function Panel No results found for this basename: PROT, ALBUMIN, AST, ALT, ALKPHOS, BILITOT, BILIDIR, IBILI,  in the last 72 hours No results found for this basename: CHOL,  in the last 72 hours No results found for this basename: PROTIME,  in the last 72  hours  Imaging: Imaging results have been reviewed and Nm Myocar Multi W/spect W/wall Motion / Ef  11/11/2013   CLINICAL DATA:  Chest pain.  Syncope.  Anemia.  EXAM: MYOCARDIAL IMAGING WITH SPECT (REST AND EXERCISE)  GATED LEFT VENTRICULAR WALL MOTION STUDY  LEFT VENTRICULAR EJECTION FRACTION  TECHNIQUE: Standard myocardial SPECT imaging was performed after resting intravenous injection of 10 mCi Tc-7932m sestamibi. Subsequently, exercise tolerance test was performed by the patient under the supervision of the Cardiology staff. At peak-stress, 30 mCi Tc-5332m sestamibi was injected intravenously and standard myocardial SPECT imaging was performed. Quantitative gated imaging was also performed to evaluate left ventricular wall motion, and estimate left ventricular ejection fraction.  COMPARISON:  Two-view chest x-ray 11/09/2013.  FINDINGS: Normal cardiac fusion is evident at rest. There is no reversible ischemia with stress.  Normal contractility and wall motion is evident.  The estimated end-diastolic volume is 105 mL. The estimated systolic volume is 42 mL. The calculated ejection fraction is 60%, within normal limits  IMPRESSION: 1. Normal nuclear medicine rest and exercise perfusion exam. 2. Normal contractility and wall motion. 3. Normal function with an  estimated ejection fraction of 60%.   Electronically Signed   By: Gennette Pac M.D.   On: 11/11/2013 15:00    Cardiac Studies:  Assessment/Plan:  Status post syncope rule out cardiac arrhythmias  Status post atypical chest pain MI ruled out negative stress Myoview  Posttraumatic stress disorder  History of panic attack  History of marijuana abuse  History of pancreatitis  Plan Continue present management I will sign off please call if needed  LOS: 3 days    Robynn Pane 11/12/2013, 9:18 AM

## 2015-02-10 IMAGING — CT CT CERVICAL SPINE W/O CM
4 of 5 series · 15 of 33 positions shown, 17 images · non-contrast
Comparison: None.

CLINICAL DATA: LOSS OF CONSCIOUSNESS

EXAM:
CT HEAD WITHOUT CONTRAST
CT CERVICAL SPINE WITHOUT CONTRAST
TECHNIQUE: Multidetector CT imaging of the head and cervical spine was
performed following the standard protocol without intravenous
contrast. Multiplanar CT image reconstructions of the cervical spine
were also generated.

[Series 5: c_spine 2.0 i40s 3 · axial · 0.30mm/px · z∈[-292,-190]mm · 4 of 87 slices shown, 5 images]
[im 18/87  soft-tissue]
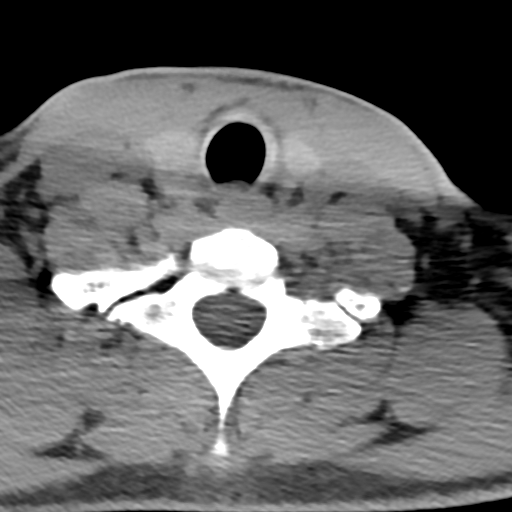
[im 18/87  bone]
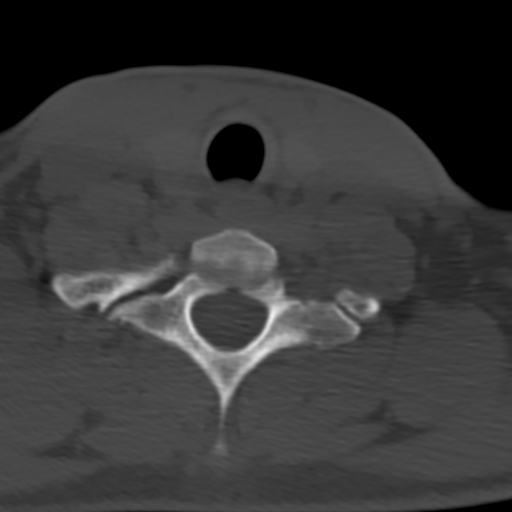
[im 35/87  bone]
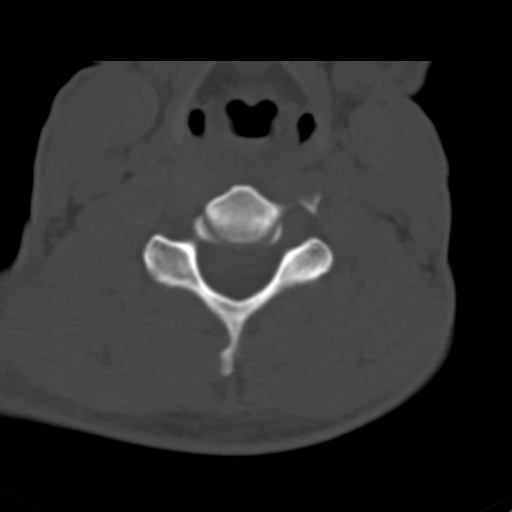
[im 52/87  bone]
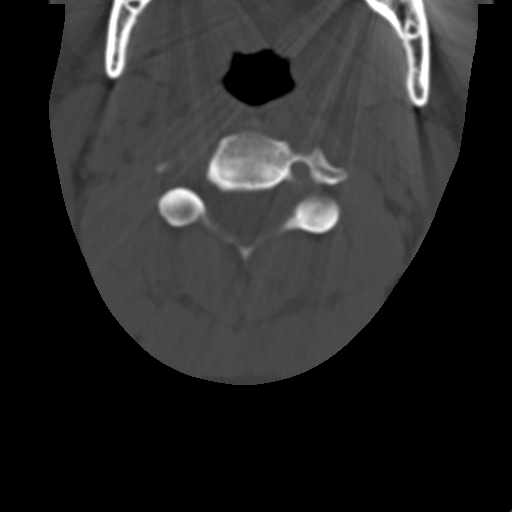
[im 69/87  bone]
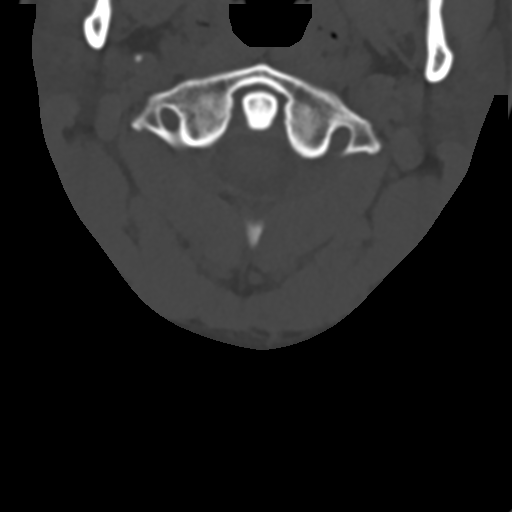

[Series 7: coronals · coronal · 0.33mm/px · 3 of 38 slices shown]
[im 8/38  bone]
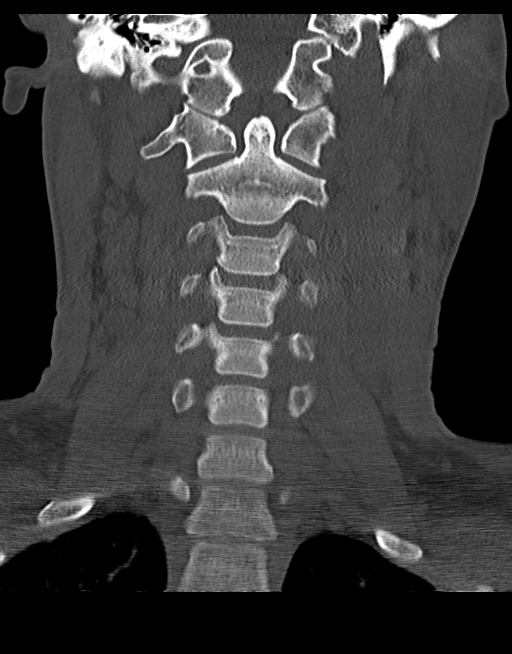
[im 15/38  bone]
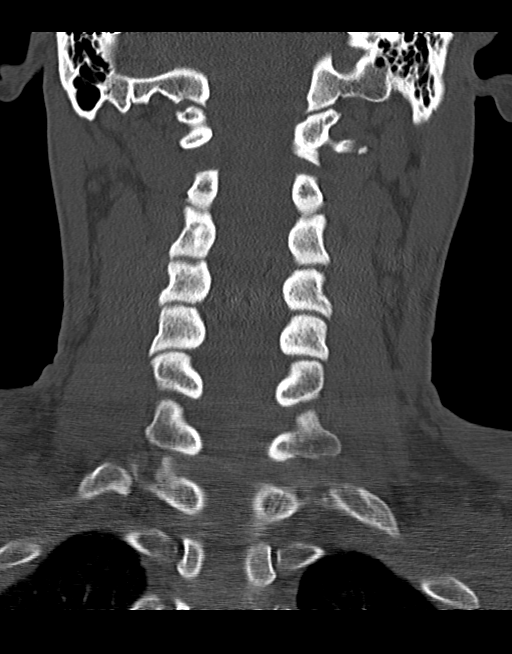
[im 23/38  bone]
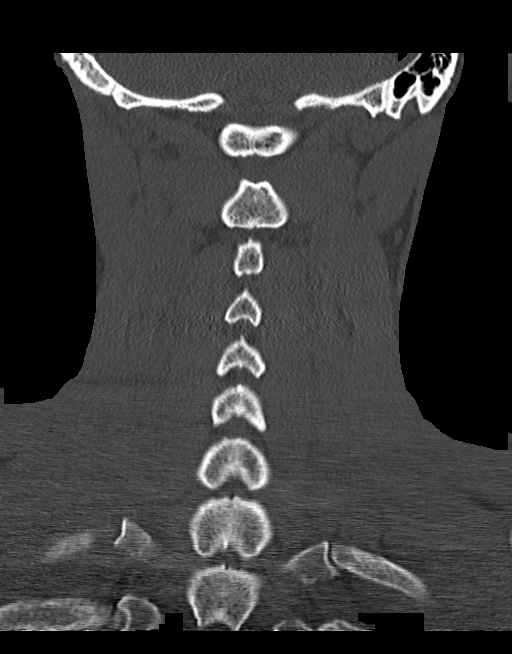

[Series 8: sagittals · sagittal · 0.32mm/px · 5 of 39 slices shown, 6 images]
[im 13/39  bone]
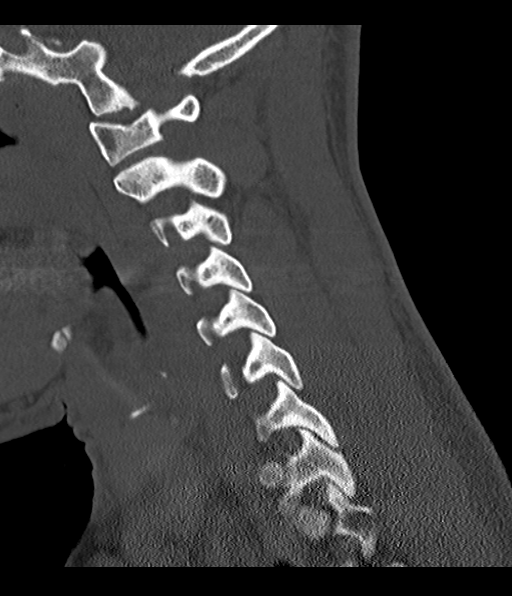
[im 16/39  bone]
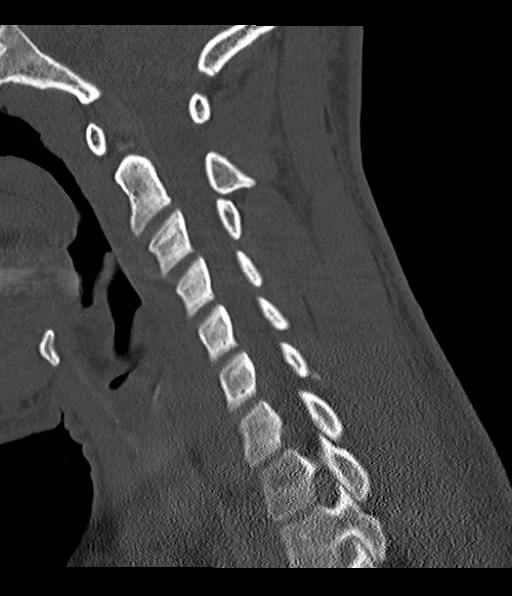
[im 20/39  soft-tissue]
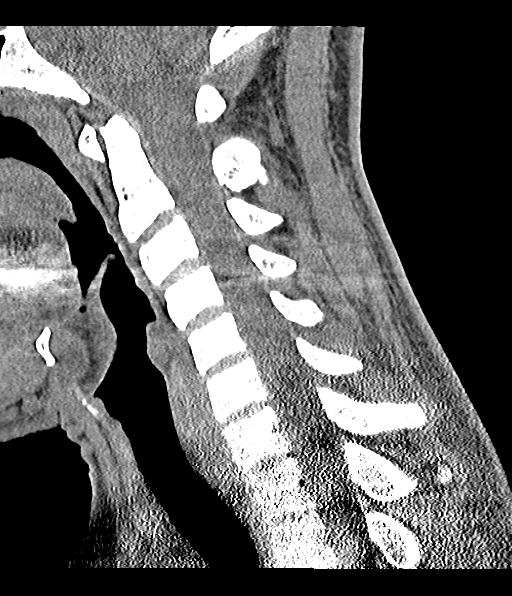
[im 20/39  bone]
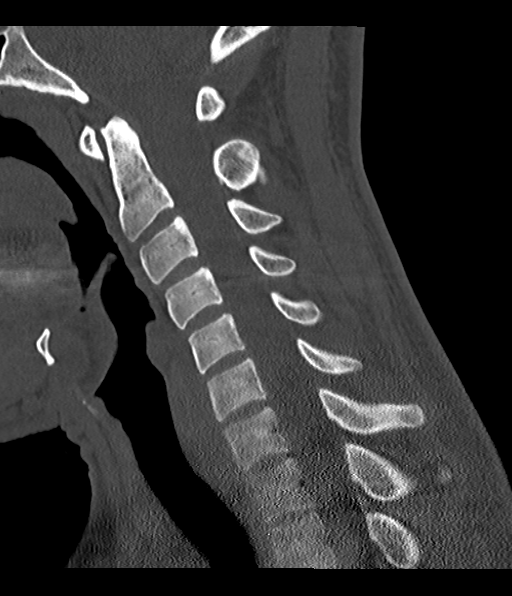
[im 23/39  bone]
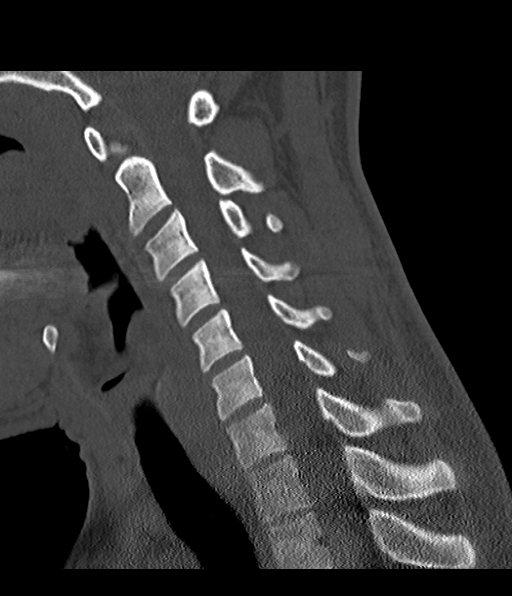
[im 26/39  bone]
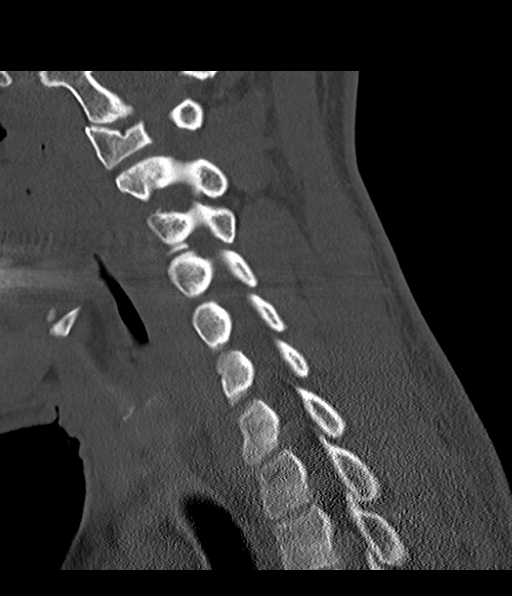

[Series 9: orthogonals · axial · 0.29mm/px · z∈[-317,-258]mm · 3 of 82 slices shown]
[im 17/82  bone]
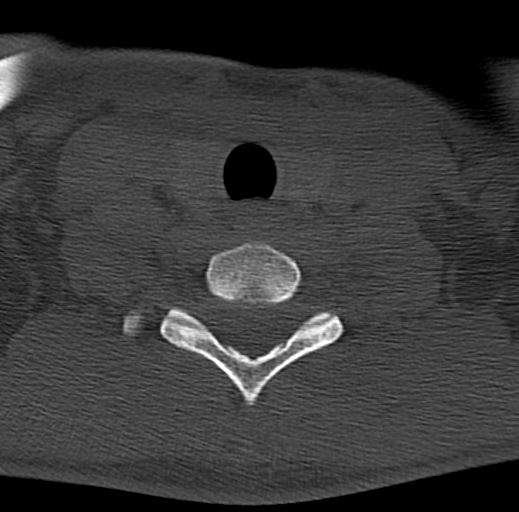
[im 33/82  bone]
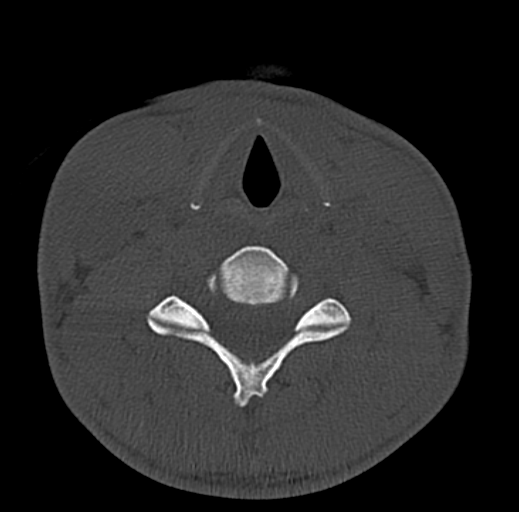
[im 49/82  bone]
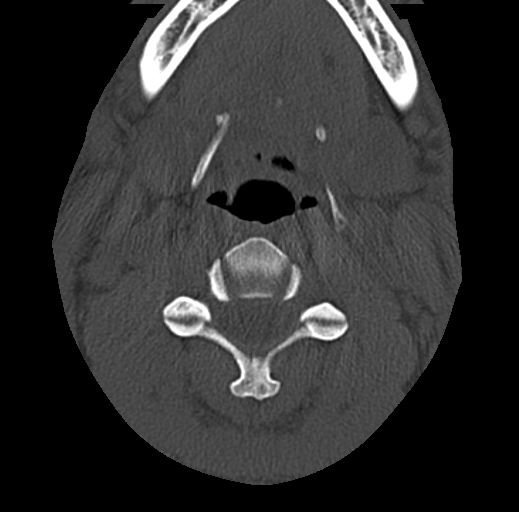

[15 of 33 positions shown; findings below may reference images not displayed]

FINDINGS: CT HEAD FINDINGS

There is no evidence of mass effect, midline shift or extra-axial
fluid collections. There is no evidence of a space-occupying lesion
or intracranial hemorrhage. There is no evidence of a cortical-based
area of acute infarction.

The ventricles and sulci are appropriate for the patient's age. The
basal cisterns are patent.

Visualized portions of the orbits are unremarkable. The visualized
portions of the paranasal sinuses and mastoid air cells are
unremarkable.

The osseous structures are unremarkable.

CT CERVICAL SPINE FINDINGS

The alignment is anatomic. The vertebral body heights are
maintained. There is no acute fracture. There is no static
listhesis. The prevertebral soft tissues are normal. The intraspinal
soft tissues are not fully imaged on this examination due to poor
soft tissue contrast, but there is no gross soft tissue abnormality.

The disc spaces are maintained.

The visualized portions of the lung apices demonstrate no focal
abnormality.
IMPRESSION: 1. No acute intracranial pathology.
2. No acute osseous injury of the cervical spine.

## 2016-10-05 ENCOUNTER — Encounter (HOSPITAL_COMMUNITY): Payer: Self-pay | Admitting: Family Medicine

## 2016-10-05 ENCOUNTER — Emergency Department (HOSPITAL_COMMUNITY)
Admission: EM | Admit: 2016-10-05 | Discharge: 2016-10-05 | Disposition: A | Discharge status: Home / Self Care | Payer: Non-veteran care | Attending: Dermatology | Admitting: Dermatology

## 2016-10-05 DIAGNOSIS — Y929 Unspecified place or not applicable: Secondary | ICD-10-CM | POA: Diagnosis not present

## 2016-10-05 DIAGNOSIS — S51812A Laceration without foreign body of left forearm, initial encounter: Secondary | ICD-10-CM | POA: Insufficient documentation

## 2016-10-05 DIAGNOSIS — Y939 Activity, unspecified: Secondary | ICD-10-CM | POA: Diagnosis not present

## 2016-10-05 DIAGNOSIS — W268XXA Contact with other sharp object(s), not elsewhere classified, initial encounter: Secondary | ICD-10-CM | POA: Diagnosis not present

## 2016-10-05 DIAGNOSIS — Y999 Unspecified external cause status: Secondary | ICD-10-CM | POA: Diagnosis not present

## 2016-10-05 DIAGNOSIS — Z5321 Procedure and treatment not carried out due to patient leaving prior to being seen by health care provider: Secondary | ICD-10-CM | POA: Insufficient documentation

## 2016-10-05 NOTE — ED Triage Notes (Signed)
Patient reports he was cutting the flaps of a box with box cutter and accidentally cut his left forearm about 21:45 last night. Pt has his left arm bandaged with bleeding controlled.

## 2016-10-05 NOTE — ED Notes (Signed)
Attempted to call patient for assigned room with no answer.  

## 2020-03-31 ENCOUNTER — Encounter (HOSPITAL_COMMUNITY): Payer: Self-pay

## 2020-03-31 ENCOUNTER — Other Ambulatory Visit: Payer: Self-pay

## 2020-03-31 ENCOUNTER — Emergency Department (HOSPITAL_COMMUNITY)
Admission: EM | Admit: 2020-03-31 | Discharge: 2020-03-31 | Disposition: A | Discharge status: Home / Self Care | Payer: No Typology Code available for payment source | Attending: Emergency Medicine | Admitting: Emergency Medicine

## 2020-03-31 DIAGNOSIS — Z5321 Procedure and treatment not carried out due to patient leaving prior to being seen by health care provider: Secondary | ICD-10-CM | POA: Insufficient documentation

## 2020-03-31 DIAGNOSIS — R0981 Nasal congestion: Secondary | ICD-10-CM | POA: Diagnosis not present

## 2020-03-31 DIAGNOSIS — R059 Cough, unspecified: Secondary | ICD-10-CM | POA: Insufficient documentation

## 2020-03-31 DIAGNOSIS — R319 Hematuria, unspecified: Secondary | ICD-10-CM | POA: Diagnosis not present

## 2020-03-31 NOTE — ED Triage Notes (Signed)
Pt arrives to ED w/ c/o hematuria that started yesterday. Pt denies pain with urination and dysuria.  Pt also reports cough and nasal congestion. Pt recently diagnosed with covid but symptoms resolved 2 and a half weeks ago and pt reports he has had a negative test since that time. Resp e/u.

## 2020-03-31 NOTE — ED Notes (Signed)
Pt states he is leaving °

## 2022-10-10 ENCOUNTER — Encounter: Payer: Self-pay | Admitting: Neurology

## 2022-10-10 ENCOUNTER — Ambulatory Visit (INDEPENDENT_AMBULATORY_CARE_PROVIDER_SITE_OTHER): Payer: Medicaid Other | Admitting: Neurology

## 2022-10-10 VITALS — BP 111/72 | HR 71 | Ht 71.0 in | Wt 156.0 lb

## 2022-10-10 DIAGNOSIS — G43009 Migraine without aura, not intractable, without status migrainosus: Secondary | ICD-10-CM

## 2022-10-10 DIAGNOSIS — Z87898 Personal history of other specified conditions: Secondary | ICD-10-CM

## 2022-10-10 DIAGNOSIS — G43109 Migraine with aura, not intractable, without status migrainosus: Secondary | ICD-10-CM

## 2022-10-10 DIAGNOSIS — R2990 Unspecified symptoms and signs involving the nervous system: Secondary | ICD-10-CM | POA: Diagnosis not present

## 2022-10-10 MED ORDER — DIVALPROEX SODIUM ER 500 MG PO TB24: 500.0000 mg | ORAL_TABLET | Freq: Every day | ORAL | 3 refills | Status: AC

## 2022-10-10 MED ORDER — ASPIRIN 81 MG PO TBEC: 81.0000 mg | DELAYED_RELEASE_TABLET | Freq: Every day | ORAL | 12 refills | Status: AC

## 2022-10-10 NOTE — Progress Notes (Signed)
[facility] [address] [place]. [place] [address] [phone]       OFFICE CONSULT NOTE  [name] Date of Birth:  [birth year] Medical Record Number:  [id number]   Referring MD:  Dr Anselm Lis  Reason for Referral: Strokelike episode  HPI: Hunter Riddle is a 42 year old African-American male who is seen today for initial office consultation visit for strokelike episode.  He is accompanied by his wife.  History is obtained from them and review of electronic medical records in care everywhere.  No actual imaging presents available for personal review today but reports were  available  in care everywhere.  He has past medical history of PTSD, panic attack and recurrent syncopal episodes of undetermined etiology.  He presented on 09/25/2022 to [facility] in [facility] with sudden onset of expressive aphasia and sensory changes on the right side of the  arm and leg But sparing the face.  He was found to have NIH stroke scale of 6 and was treated with IV TNK with significant resolution of his symptoms.  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  Patient left AGAINST MEDICAL ADVICE and refused further workup.  The angiogram of the brain and neck showed no large vessel stenosis or occlusion.  Patient states he did have some headache later.  He does give history of headaches that sound like migraine but he has never had a formal diagnosis.  The headaches are moderate to severe pounding in quality and disabling enough that he often has to stop what he is doing.  He does take ibuprofen which helps most occasions.  He has been seen once in the ER for his headaches which resolved after given an injection.  He has never followed up with a neurologist for his migraines.  There is no accompanying nausea but there is light and sound sensitivity.  The headaches decreases when he lies  down during the headaches.  He does not complain of vision aura prior to his headaches. The patient also states he is out of at least 5 episodes of brief passing out that he can remember.  He feels dizzy at onset and has to hold onto something and lay down but loses consciousness quickly.  He wakes up in a few minutes slightly disoriented.  He was seen May 2015 at [facility] for 1 such episode and underwent extensive cardiac workup which was unremarkable.  CT head was unremarkable but he did not get an MRI at that time.  EEG 11/10/2013 was unremarkable.  He however never followed up with a neurologist for logical workup.  Interestingly the MRI scan done at [facility] left lateral ventricle heterotropia which could put him at risk for seizures. ROS:   14 system review of systems is positive for expressive aphasia, numbness, headaches, blurred vision, passing out episodes and all other systems negative  PMH:  Past Medical History:  Diagnosis Date   Panic attack    PTSD (post-traumatic stress disorder)    Syncopal episodes     Social History:  Social History   Socioeconomic History   Marital status: Single    Spouse name: Not on file   Number of children: Not on file   Years of education: Not on file   Highest education level: Not on file  Occupational History   Not on file  Tobacco Use   Smoking status: Never  Smokeless tobacco: Never  Vaping Use   Vaping Use: Some days  Substance and Sexual Activity   Alcohol use: No   Drug use: No   Sexual activity: Not on file  Other Topics Concern   Not on file  Social History Narrative   Not on file   Social Determinants of Health   Financial Resource Strain: Not on file  Food Insecurity: Not on file  Transportation Needs: Not on file  Physical Activity: Not on file  Stress: Not on file  Social Connections: Not on file  Intimate Partner Violence: Not on file    Medications:   No current outpatient medications on file prior to  visit.   No current facility-administered medications on file prior to visit.    Allergies:   Allergies  Allergen Reactions   Penicillins Anaphylaxis    Physical Exam General: Very African-American male, seated, in no evident distress Head: head normocephalic and atraumatic.   Neck: supple with no carotid or supraclavicular bruits Cardiovascular: regular rate and rhythm, no murmurs Musculoskeletal: no deformity Skin:  no rash/petichiae Vascular:  Normal pulses all extremities  Neurologic Exam Mental Status: Awake and fully alert. Oriented to place and time. Recent and remote memory intact. Attention span, concentration and fund of knowledge appropriate. Mood and affect appropriate.  Cranial Nerves: Fundoscopic exam reveals sharp disc margins. Pupils equal, briskly reactive to light. Extraocular movements full without nystagmus. Visual fields full to confrontation. Hearing intact. Facial sensation intact. Face, tongue, palate moves normally and symmetrically.  Motor: Normal bulk and tone. Normal strength in all tested extremity muscles. Sensory.: intact to touch , pinprick , position and vibratory sensation.  Coordination: Rapid alternating movements normal in all extremities. Finger-to-nose and heel-to-shin performed accurately bilaterally. Gait and Station: Arises from chair without difficulty. Stance is normal. Gait demonstrates normal stride length and balance . Able to heel, toe and tandem walk without difficulty.  Reflexes: 1+ and symmetric. Toes downgoing.   NIHSS  0 Modified Rankin  0   ASSESSMENT: 42 year old African-American male with strokelike episode of transient expressive aphasia and right body paresthesias in April 2024 followed by headache treated with IV TNK possibly complicated migraine episode with negative brain imaging for acute ischemia..  Vascular risk factors of smoking only.  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     PLAN:I had a long discussion with the patient and his wife regarding his recent episode of strokelike symptoms followed by headache likely representing complicated migraine episode.  He also had 3 of syncopal episodes in the past.  Recommend further evaluation by checking TCD bubble study for PFO.  Trial of Depakote ER 500 mg daily for migraine as well as seizure prophylaxis given abnormal MRI history of episodes of passing out.  He was advised to use over-the-counter analgesics for symptomatic relief.  Start aspirin 81 mg daily for stroke prevention was counseled to quit smoking.  Suggest aggressive risk factor modification.  I advised him to get a primary care physician to help with smoking cessation.  Check EEG for seizures he will return for follow-up in 3 months or call earlier if necessary.  Greater than 50% time during this prolonged 60-minute consultation visit was spent on counseling and coordination of care about his strokelike episode as well as episodes of passing out and discussion about abnormal MRI needed for migraine and seizure prophylaxis  and answering  Delia Heady, MD Note: This document was prepared with digital dictation and possible smart phrase technology. Any transcriptional errors that result from this process are unintentional.

## 2022-10-10 NOTE — Patient Instructions (Signed)
I had a long discussion with the patient and his wife regarding his recent episode of strokelike symptoms followed by headache likely representing complicated migraine episode.  He also had 3 of syncopal episodes in the past.  Recommend further evaluation by checking TCD bubble study for PFO.  Trial of Depakote ER 500 mg daily for migraine prophylaxis and to use over-the-counter analgesics for symptomatic relief.  Start aspirin 81 mg daily for stroke prevention was counseled to quit smoking.  Suggest aggressive risk factor modification.  I advised him to get a primary care physician to help with smoking cessation.  He will return for follow-up in 3 months or call earlier if necessary.

## 2022-10-24 ENCOUNTER — Other Ambulatory Visit: Payer: Medicaid Other | Admitting: *Deleted

## 2022-10-24 ENCOUNTER — Encounter: Payer: Self-pay | Admitting: *Deleted

## 2022-11-09 ENCOUNTER — Ambulatory Visit (HOSPITAL_COMMUNITY): Admission: RE | Admit: 2022-11-09 | Payer: Medicaid Other | Source: Ambulatory Visit

## 2022-11-21 ENCOUNTER — Other Ambulatory Visit: Payer: Medicaid Other | Admitting: *Deleted

## 2022-11-23 ENCOUNTER — Telehealth: Payer: Self-pay | Admitting: Neurology

## 2022-11-23 NOTE — Telephone Encounter (Signed)
Received a surgical clearance request form from the patient's dentist. Pt had stroke like symptoms which was thought to be consistent with complicated migraine. Pt was recommended to take asa 81 mg daily. I have advised that pt should hold the medication 3 days prior to the procedure. Will placed in Dr Marlis Edelson bin to sign.

## 2022-12-06 ENCOUNTER — Other Ambulatory Visit: Payer: Medicaid Other | Admitting: *Deleted

## 2022-12-08 ENCOUNTER — Telehealth: Payer: Self-pay | Admitting: Neurology

## 2022-12-08 NOTE — Telephone Encounter (Signed)
LVM and sent MyChart message informing pt of EEG reschedule- Tresa Endo out 7/22.

## 2023-01-02 ENCOUNTER — Other Ambulatory Visit: Payer: Medicaid Other | Admitting: *Deleted

## 2023-01-03 ENCOUNTER — Other Ambulatory Visit: Payer: Medicaid Other | Admitting: *Deleted

## 2023-01-03 ENCOUNTER — Encounter: Payer: Self-pay | Admitting: *Deleted

## 2023-05-08 ENCOUNTER — Ambulatory Visit: Payer: Medicaid Other | Admitting: Neurology

## 2023-05-08 ENCOUNTER — Encounter: Payer: Self-pay | Admitting: Neurology
